# Patient Record
Sex: Female | Born: 1965 | Race: White | Hispanic: No | Smoking: Never smoker
Health system: Southern US, Community
[De-identification: ages and names within clinical notes are randomized; demographics above are authoritative.]

## PROBLEM LIST (undated history)

## (undated) DIAGNOSIS — E8881 Metabolic syndrome: Secondary | ICD-10-CM

## (undated) DIAGNOSIS — G43909 Migraine, unspecified, not intractable, without status migrainosus: Secondary | ICD-10-CM

## (undated) DIAGNOSIS — E88819 Insulin resistance, unspecified: Secondary | ICD-10-CM

## (undated) DIAGNOSIS — J45909 Unspecified asthma, uncomplicated: Secondary | ICD-10-CM

## (undated) DIAGNOSIS — F410 Panic disorder [episodic paroxysmal anxiety] without agoraphobia: Secondary | ICD-10-CM

## (undated) HISTORY — DX: Unspecified asthma, uncomplicated: J45.909

## (undated) HISTORY — DX: Insulin resistance, unspecified: E88.819

## (undated) HISTORY — DX: Metabolic syndrome: E88.81

## (undated) HISTORY — PX: CHOLECYSTECTOMY: SHX55

---
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
## 1998-01-09 ENCOUNTER — Other Ambulatory Visit: Admission: RE | Admit: 1998-01-09 | Discharge: 1998-01-09 | Payer: Self-pay | Admitting: Gynecology

## 1998-01-15 ENCOUNTER — Other Ambulatory Visit: Admission: RE | Admit: 1998-01-15 | Discharge: 1998-01-15 | Payer: Self-pay | Admitting: Obstetrics and Gynecology

## 1998-01-29 ENCOUNTER — Inpatient Hospital Stay (HOSPITAL_COMMUNITY): Admission: AD | Admit: 1998-01-29 | Discharge: 1998-02-01 | Payer: Self-pay | Admitting: Obstetrics and Gynecology

## 1998-04-12 ENCOUNTER — Other Ambulatory Visit: Admission: RE | Admit: 1998-04-12 | Discharge: 1998-04-12 | Payer: Self-pay | Admitting: Obstetrics and Gynecology

## 1999-09-06 ENCOUNTER — Other Ambulatory Visit: Admission: RE | Admit: 1999-09-06 | Discharge: 1999-09-06 | Payer: Self-pay | Admitting: Gynecology

## 2007-04-15 ENCOUNTER — Ambulatory Visit (HOSPITAL_COMMUNITY): Admission: RE | Admit: 2007-04-15 | Discharge: 2007-04-15 | Payer: Self-pay | Admitting: Family Medicine

## 2010-09-29 ENCOUNTER — Encounter: Payer: Self-pay | Admitting: Internal Medicine

## 2012-06-15 ENCOUNTER — Other Ambulatory Visit: Payer: Self-pay | Admitting: Nurse Practitioner

## 2012-10-29 ENCOUNTER — Other Ambulatory Visit: Payer: Self-pay | Admitting: Nurse Practitioner

## 2012-11-24 ENCOUNTER — Ambulatory Visit
Admission: RE | Admit: 2012-11-24 | Discharge: 2012-11-24 | Disposition: A | Discharge status: Home / Self Care | Payer: BC Managed Care – PPO | Source: Ambulatory Visit | Attending: Nurse Practitioner | Admitting: Nurse Practitioner

## 2012-11-24 DIAGNOSIS — Z1231 Encounter for screening mammogram for malignant neoplasm of breast: Secondary | ICD-10-CM

## 2012-11-26 ENCOUNTER — Other Ambulatory Visit: Payer: Self-pay | Admitting: Nurse Practitioner

## 2012-11-26 DIAGNOSIS — N63 Unspecified lump in unspecified breast: Secondary | ICD-10-CM

## 2012-11-29 ENCOUNTER — Encounter: Payer: Self-pay | Admitting: Nurse Practitioner

## 2012-11-29 ENCOUNTER — Ambulatory Visit (INDEPENDENT_AMBULATORY_CARE_PROVIDER_SITE_OTHER): Payer: BC Managed Care – PPO | Admitting: Nurse Practitioner

## 2012-11-29 VITALS — BP 110/70 | Temp 98.3°F | Wt 200.8 lb

## 2012-11-29 DIAGNOSIS — R928 Other abnormal and inconclusive findings on diagnostic imaging of breast: Secondary | ICD-10-CM

## 2012-11-29 DIAGNOSIS — R300 Dysuria: Secondary | ICD-10-CM

## 2012-11-29 DIAGNOSIS — J45909 Unspecified asthma, uncomplicated: Secondary | ICD-10-CM | POA: Insufficient documentation

## 2012-11-29 DIAGNOSIS — E8881 Metabolic syndrome: Secondary | ICD-10-CM | POA: Insufficient documentation

## 2012-11-29 LAB — POCT UA - MICROSCOPIC ONLY

## 2012-11-29 MED ORDER — CEFPROZIL 500 MG PO TABS: 500.0000 mg | ORAL_TABLET | Freq: Two times a day (BID) | ORAL | Status: AC

## 2012-11-29 MED ORDER — BUPROPION HCL ER (XL) 150 MG PO TB24: 150.0000 mg | ORAL_TABLET | Freq: Every day | ORAL | Status: DC

## 2012-11-29 NOTE — Assessment & Plan Note (Addendum)
Assessment: Dysuria possible UTI Cefzil 500 mg 1 by mouth twice a day x7 days. use AZO for 48 hours then discontinue.   Call back in 72 hours if no improvement. Warning signs reviewed.

## 2012-11-29 NOTE — Patient Instructions (Addendum)
May use AZO for 48 hours then discontinue.  Call back in 72 hours if no improvement.

## 2012-11-30 DIAGNOSIS — R928 Other abnormal and inconclusive findings on diagnostic imaging of breast: Secondary | ICD-10-CM | POA: Insufficient documentation

## 2012-11-30 NOTE — Progress Notes (Signed)
Subjective: Presents complaints of left lower abdominal pain for the past several days. No fever. Some urgency and discomfort with urination. No back pain. No discharge. Discomfort at the end of urination. No previous history of UTI. Some relief with AZO. Has a history of kidney stones. Taking fluids well. No nausea vomiting. No new sexual partners. Objective: NAD. Alert, oriented. Lungs clear. Heart regular rate rhythm. No CVA area tenderness. No flank tenderness. Abdomen soft nondistended with mild suprapubic discomfort. See urine microscopic results. Unable to do UA because of AZO use.

## 2012-12-07 ENCOUNTER — Telehealth: Payer: Self-pay | Admitting: Family Medicine

## 2012-12-07 HISTORY — PX: BREAST BIOPSY: SHX20

## 2012-12-07 NOTE — Telephone Encounter (Signed)
Pt stated she went to er because of back pain but pain stopped so she never signed in at the er. Today having low abd pain and painful urination. Stated she passed a stone sat. Taking advil for the pain. Has called [facility] but waiting for a call back.

## 2012-12-07 NOTE — Telephone Encounter (Signed)
May use and call in Cipro 500 one twice a day for 7 days. She should try to get in with [facility] f/u if fevers or worse. If wanting ov then weds

## 2012-12-07 NOTE — Telephone Encounter (Signed)
As sooner she is sessile she'stp Ciproo cefzil and go on for is . Use cipro. Hydrocodone 5/325  #30 no refills use one every 4 hours as needed for pain

## 2012-12-07 NOTE — Telephone Encounter (Signed)
Patient states she came in last week and saw Sara Powers for kidney stones and was told if she had another flare up to let her know.  She had to go to the ER over the weekend and by the time she got to the hospital it had stopped.  It has now flared up again.  What do you recommend?

## 2012-12-07 NOTE — Telephone Encounter (Signed)
Pt.notified

## 2012-12-07 NOTE — Telephone Encounter (Signed)
[place] pt to stop cefzil and start cipro. cipro and hydrocodone already called into [facility].

## 2012-12-07 NOTE — Telephone Encounter (Signed)
Pt states she is still taking cefzil prescribed by carolyn. Should she stop and start cipro or take both and can she have something for pain called in. Using advil now no relief. [employer]

## 2012-12-09 ENCOUNTER — Ambulatory Visit
Admission: RE | Admit: 2012-12-09 | Discharge: 2012-12-09 | Disposition: A | Discharge status: Home / Self Care | Payer: BC Managed Care – PPO | Source: Ambulatory Visit | Attending: Nurse Practitioner | Admitting: Nurse Practitioner

## 2012-12-09 ENCOUNTER — Other Ambulatory Visit: Payer: Self-pay | Admitting: Nurse Practitioner

## 2012-12-09 DIAGNOSIS — N63 Unspecified lump in unspecified breast: Secondary | ICD-10-CM

## 2012-12-15 ENCOUNTER — Ambulatory Visit
Admission: RE | Admit: 2012-12-15 | Discharge: 2012-12-15 | Disposition: A | Discharge status: Home / Self Care | Payer: BC Managed Care – PPO | Source: Ambulatory Visit | Attending: Nurse Practitioner | Admitting: Nurse Practitioner

## 2012-12-15 DIAGNOSIS — N63 Unspecified lump in unspecified breast: Secondary | ICD-10-CM

## 2013-08-03 ENCOUNTER — Other Ambulatory Visit: Payer: Self-pay | Admitting: *Deleted

## 2013-08-03 ENCOUNTER — Ambulatory Visit (INDEPENDENT_AMBULATORY_CARE_PROVIDER_SITE_OTHER): Payer: BC Managed Care – PPO | Admitting: Nurse Practitioner

## 2013-08-03 ENCOUNTER — Telehealth: Payer: Self-pay | Admitting: Family Medicine

## 2013-08-03 ENCOUNTER — Encounter: Payer: Self-pay | Admitting: Nurse Practitioner

## 2013-08-03 VITALS — BP 130/88 | Temp 98.1°F | Ht 66.0 in | Wt 202.0 lb

## 2013-08-03 DIAGNOSIS — I889 Nonspecific lymphadenitis, unspecified: Secondary | ICD-10-CM

## 2013-08-03 DIAGNOSIS — J329 Chronic sinusitis, unspecified: Secondary | ICD-10-CM

## 2013-08-03 MED ORDER — CEFUROXIME AXETIL 500 MG PO TABS: 500.0000 mg | ORAL_TABLET | Freq: Two times a day (BID) | ORAL | Status: DC

## 2013-08-03 MED ORDER — HYDROCODONE-ACETAMINOPHEN 5-325 MG PO TABS: 1.0000 | ORAL_TABLET | ORAL | Status: DC | PRN

## 2013-08-03 NOTE — Telephone Encounter (Signed)
Patient says that medication was not at pharmacy. She was seen today. Please advise.     Sara Powers

## 2013-08-03 NOTE — Telephone Encounter (Signed)
Discussed with pt med was sent over but i resent antibiotic to [facility].

## 2013-08-06 ENCOUNTER — Encounter: Payer: Self-pay | Admitting: Nurse Practitioner

## 2013-08-06 NOTE — Progress Notes (Signed)
Subjective:  Presents complaints of sore throat for the past several days. No fever. Facial area headache. Occasional cough. Producing green drainage. No wheezing. Very painful lump behind her right ear. Radiates into the right ear area.  Objective:   BP 130/88  Temp(Src) 98.1 F (36.7 C) (Oral)  Ht 5\' 6"  (1.676 m)  Wt 202 lb (91.627 kg)  BMI 32.62 kg/m2 NAD. Alert, oriented. TMs significant clear effusion, no erythema. Pharynx injected with PND noted. Neck supple with mild soft nontender adenopathy on the left, enlarged tender right tonsillar lymph node. Lungs clear. Heart regular rate rhythm.  Assessment:Rhinosinusitis  Lymphadenitis  Plan: Meds ordered this encounter  Medications  . DISCONTD: cefUROXime (CEFTIN) 500 MG tablet    Sig: Take 1 tablet (500 mg total) by mouth 2 (two) times daily with a meal.    Dispense:  20 tablet    Refill:  0    Order Specific Question:  Supervising Provider    Answer:  Merlyn Albert [[id number]]  . HYDROcodone-acetaminophen (NORCO/VICODIN) 5-325 MG per tablet    Sig: Take 1 tablet by mouth every 4 (four) hours as needed.    Dispense:  30 tablet    Refill:  0    Order Specific Question:  Supervising Provider    Answer:  Merlyn Albert [[id number]]   OTC meds as directed. Expect gradual resolution of lymphadenitis. Callback in 7-10 days if no improvement, sooner if worse. Recheck if persists.

## 2013-09-05 ENCOUNTER — Ambulatory Visit (INDEPENDENT_AMBULATORY_CARE_PROVIDER_SITE_OTHER): Payer: BC Managed Care – PPO | Admitting: Nurse Practitioner

## 2013-09-05 ENCOUNTER — Encounter: Payer: Self-pay | Admitting: Nurse Practitioner

## 2013-09-05 VITALS — BP 120/80 | Ht 66.0 in | Wt 200.0 lb

## 2013-09-05 DIAGNOSIS — F341 Dysthymic disorder: Secondary | ICD-10-CM

## 2013-09-05 DIAGNOSIS — F329 Major depressive disorder, single episode, unspecified: Secondary | ICD-10-CM

## 2013-09-05 DIAGNOSIS — E8881 Metabolic syndrome: Secondary | ICD-10-CM

## 2013-09-05 MED ORDER — BUPROPION HCL ER (XL) 300 MG PO TB24: 300.0000 mg | ORAL_TABLET | Freq: Every day | ORAL | Status: DC

## 2013-09-08 ENCOUNTER — Encounter: Payer: Self-pay | Admitting: Nurse Practitioner

## 2013-09-08 DIAGNOSIS — F329 Major depressive disorder, single episode, unspecified: Secondary | ICD-10-CM | POA: Insufficient documentation

## 2013-09-08 DIAGNOSIS — F419 Anxiety disorder, unspecified: Secondary | ICD-10-CM | POA: Insufficient documentation

## 2013-09-08 NOTE — Assessment & Plan Note (Signed)
.   buPROPion (WELLBUTRIN XL) 300 MG 24 hr tablet    Sig: Take 1 tablet (300 mg total) by mouth daily.    Dispense:  30 tablet    Refill:  2    Order Specific Question:  Supervising Provider    Answer:  Merlyn AlbertLUKING, WILLIAM S [[id number]]   recommend healthy diet, regular activity and weight loss. Discussed importance of stress reduction. Will increase Wellbutrin dose to 300 mg, patient to call if any problems with new dosing. Reminded patient about potential for adverse effects if Wellbutrin is taking with alcohol. Recheck in 3 months, call back sooner if any problems.

## 2013-09-08 NOTE — Assessment & Plan Note (Signed)
.   buPROPion (WELLBUTRIN XL) 300 MG 24 hr tablet    Sig: Take 1 tablet (300 mg total) by mouth daily.    Dispense:  30 tablet    Refill:  2    Order Specific Question:  Supervising Provider    Answer:  LUKING, WILLIAM S [[id number]]   recommend healthy diet, regular activity and weight loss. Discussed importance of stress reduction. Will increase Wellbutrin dose to 300 mg, patient to call if any problems with new dosing. Reminded patient about potential for adverse effects if Wellbutrin is taking with alcohol. Recheck in 3 months, call back sooner if any problems. 

## 2013-09-08 NOTE — Progress Notes (Signed)
Subjective:  Presents for routine followup. FBS at home has run 123-142. Would like to increase her Wellbutrin dose. 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 Has not done well with her diet lately. Minimal exercise. No chest pain shortness of breath or edema. Gets regular preventive health physicals.  Objective:   BP 120/80  Ht 5\' 6"  (1.676 m)  Wt 200 lb (90.719 kg)  BMI 32.30 kg/m2 NAD. Alert, oriented. Lungs clear. Heart regular rhythm. Lower extremities no edema. Hemoglobin A1c 5.0.   Assessment:Insulin resistance - Plan: POCT glycosylated hemoglobin (Hb A1C)  Anxiety and depression   Plan: Meds ordered this encounter  Medications  . buPROPion (WELLBUTRIN XL) 300 MG 24 hr tablet    Sig: Take 1 tablet (300 mg total) by mouth daily.    Dispense:  30 tablet    Refill:  2    Order Specific Question:  Supervising Provider    Answer:  Merlyn AlbertLUKING, WILLIAM S [[id number]]   recommend healthy diet, regular activity and weight loss. Discussed importance of stress reduction. Will increase Wellbutrin dose to 300 mg, patient to call if any problems with new dosing. Reminded patient about potential for adverse effects if Wellbutrin is taking with alcohol. Recheck in 3 months, call back sooner if any problems.

## 2013-09-14 ENCOUNTER — Telehealth: Payer: Self-pay | Admitting: Family Medicine

## 2013-09-14 NOTE — Telephone Encounter (Signed)
Patient says that generic wellbutrin is not doing good. She says she wants to try something more for just anxiety. Please advise.

## 2013-09-14 NOTE — Telephone Encounter (Signed)
Patient would like to stop Wellbutrin. She has tried Lexapro in the past, and liked it. She is also open to other anxiety meds.

## 2013-09-14 NOTE — Telephone Encounter (Signed)
As we discussed Wellbutrin alone (generic or name brand) are not the best choices for anxiety. Two questions: has she taken any other meds in the past? Does she want to continue the low dose Wellbutrin 150 mg and add on med or stop this and switch?

## 2013-09-15 ENCOUNTER — Other Ambulatory Visit: Payer: Self-pay | Admitting: Nurse Practitioner

## 2013-09-15 MED ORDER — ESCITALOPRAM OXALATE 10 MG PO TABS: 10.0000 mg | ORAL_TABLET | Freq: Every day | ORAL | Status: DC

## 2013-09-20 NOTE — Progress Notes (Signed)
Left message on voicemail to return call.

## 2013-09-22 NOTE — Progress Notes (Signed)
Discussed with patient. Med sent to pharm.  

## 2013-11-30 ENCOUNTER — Ambulatory Visit: Payer: BC Managed Care – PPO | Admitting: Family Medicine

## 2013-12-07 ENCOUNTER — Encounter: Payer: Self-pay | Admitting: *Deleted

## 2014-03-24 ENCOUNTER — Other Ambulatory Visit: Payer: Self-pay

## 2014-03-24 DIAGNOSIS — Z1231 Encounter for screening mammogram for malignant neoplasm of breast: Secondary | ICD-10-CM

## 2014-03-28 ENCOUNTER — Ambulatory Visit
Admission: RE | Admit: 2014-03-28 | Discharge: 2014-03-28 | Disposition: A | Discharge status: Home / Self Care | Payer: BC Managed Care – PPO | Source: Ambulatory Visit

## 2014-03-28 ENCOUNTER — Encounter (INDEPENDENT_AMBULATORY_CARE_PROVIDER_SITE_OTHER): Payer: Self-pay

## 2014-03-28 DIAGNOSIS — Z1231 Encounter for screening mammogram for malignant neoplasm of breast: Secondary | ICD-10-CM

## 2014-06-02 ENCOUNTER — Ambulatory Visit (INDEPENDENT_AMBULATORY_CARE_PROVIDER_SITE_OTHER): Payer: BC Managed Care – PPO | Admitting: Nurse Practitioner

## 2014-06-02 VITALS — BP 118/86 | Ht 66.0 in | Wt 200.0 lb

## 2014-06-02 DIAGNOSIS — R5383 Other fatigue: Secondary | ICD-10-CM

## 2014-06-02 DIAGNOSIS — F329 Major depressive disorder, single episode, unspecified: Secondary | ICD-10-CM

## 2014-06-02 DIAGNOSIS — N920 Excessive and frequent menstruation with regular cycle: Secondary | ICD-10-CM

## 2014-06-02 DIAGNOSIS — F341 Dysthymic disorder: Secondary | ICD-10-CM

## 2014-06-02 DIAGNOSIS — R5381 Other malaise: Secondary | ICD-10-CM

## 2014-06-02 DIAGNOSIS — Z79899 Other long term (current) drug therapy: Secondary | ICD-10-CM

## 2014-06-02 DIAGNOSIS — F419 Anxiety disorder, unspecified: Principal | ICD-10-CM

## 2014-06-02 DIAGNOSIS — Z0189 Encounter for other specified special examinations: Secondary | ICD-10-CM

## 2014-06-02 MED ORDER — VENLAFAXINE HCL ER 37.5 MG PO CP24: ORAL_CAPSULE | ORAL | Status: DC

## 2014-06-05 ENCOUNTER — Encounter: Payer: Self-pay | Admitting: Nurse Practitioner

## 2014-06-05 DIAGNOSIS — N920 Excessive and frequent menstruation with regular cycle: Secondary | ICD-10-CM | POA: Insufficient documentation

## 2014-06-05 NOTE — Progress Notes (Signed)
Subjective:  Presents for recheck on her anxiety and depression. Did not see much improvement on Lexapro. Would like to try Effexor, her sister is taking this with good results. Having regular cycles with very heavy flow requiring a tampon and a pad lasting about 3 days. Generalized fatigue. Continues significant stress in her personal life. Some sleep disturbance. Social isolation. Denies suicidal thoughts or ideation.  Objective:   BP 118/86  Ht  (1.676 m)  Wt 200 lb (90.719 kg)  BMI 32.30 kg/m2 NAD. Alert, oriented. Cheerful affect. Fatigued in appearance. Lungs clear. Heart regular rate rhythm.  Assessment:  Problem List Items Addressed This Visit     Other   Anxiety and depression - Primary (Chronic)    Other Visit Diagnoses   Other malaise and fatigue        Relevant Orders       CBC with Differential       TSH       Vit D  25 hydroxy (rtn osteoporosis monitoring)    Encounter for long-term (current) use of other medications        Relevant Orders       Hepatic function panel       Basic metabolic panel    Other specified examination        Relevant Orders       Lipid panel    Menorrhagia with regular cycle          Plan:  Meds ordered this encounter  Medications  . venlafaxine XR (EFFEXOR-XR) 37.5 MG 24 hr capsule    Sig: One po qd x 6 d then 2 each day    Dispense:  60 capsule    Refill:  0    Order Specific Question:  Supervising Provider    Answer:  Riccardo Dubin   Lab work pending. Recommend that she talk to her gynecologist regarding options for heavy bleeding. Reviewed potential adverse effects of Effexor. DC med and call if any problems. Return in about 1 month (around 07/02/2014).

## 2014-06-06 LAB — CBC WITH DIFFERENTIAL/PLATELET
BASOS ABS: 0.1 10*3/uL (ref 0.0–0.1)
Basophils Relative: 1 % (ref 0–1)
Eosinophils Absolute: 0.2 10*3/uL (ref 0.0–0.7)
Eosinophils Relative: 3 % (ref 0–5)
HEMATOCRIT: 38.4 % (ref 36.0–46.0)
HEMOGLOBIN: 13.3 g/dL (ref 12.0–15.0)
LYMPHS ABS: 2.1 10*3/uL (ref 0.7–4.0)
LYMPHS PCT: 35 % (ref 12–46)
MCH: 29.4 pg (ref 26.0–34.0)
MCHC: 34.6 g/dL (ref 30.0–36.0)
MCV: 85 fL (ref 78.0–100.0)
MONO ABS: 0.4 10*3/uL (ref 0.1–1.0)
Monocytes Relative: 7 % (ref 3–12)
NEUTROS ABS: 3.2 10*3/uL (ref 1.7–7.7)
Neutrophils Relative %: 54 % (ref 43–77)
Platelets: 321 10*3/uL (ref 150–400)
RBC: 4.52 MIL/uL (ref 3.87–5.11)
RDW: 13.6 % (ref 11.5–15.5)
WBC: 5.9 10*3/uL (ref 4.0–10.5)

## 2014-06-06 LAB — TSH: TSH: 3.056 u[IU]/mL (ref 0.350–4.500)

## 2014-06-06 LAB — LIPID PANEL
CHOL/HDL RATIO: 3.9 ratio
CHOLESTEROL: 177 mg/dL (ref 0–200)
HDL: 45 mg/dL (ref >39)
LDL Cholesterol: 106 mg/dL — ABNORMAL HIGH (ref 0–99)
Triglycerides: 129 mg/dL (ref <150)
VLDL: 26 mg/dL (ref 0–40)

## 2014-06-06 LAB — BASIC METABOLIC PANEL
BUN: 10 mg/dL (ref 6–23)
CALCIUM: 9 mg/dL (ref 8.4–10.5)
CO2: 22 mEq/L (ref 19–32)
Chloride: 106 mEq/L (ref 96–112)
Creat: 0.7 mg/dL (ref 0.50–1.10)
Glucose, Bld: 108 mg/dL — ABNORMAL HIGH (ref 70–99)
POTASSIUM: 4.1 meq/L (ref 3.5–5.3)
SODIUM: 136 meq/L (ref 135–145)

## 2014-06-06 LAB — HEPATIC FUNCTION PANEL
ALBUMIN: 4 g/dL (ref 3.5–5.2)
ALT: 17 U/L (ref 0–35)
AST: 15 U/L (ref 0–37)
Alkaline Phosphatase: 60 U/L (ref 39–117)
Bilirubin, Direct: 0.1 mg/dL (ref 0.0–0.3)
Indirect Bilirubin: 0.6 mg/dL (ref 0.2–1.2)
TOTAL PROTEIN: 6.5 g/dL (ref 6.0–8.3)
Total Bilirubin: 0.7 mg/dL (ref 0.2–1.2)

## 2014-06-07 LAB — VITAMIN D 25 HYDROXY (VIT D DEFICIENCY, FRACTURES): VIT D 25 HYDROXY: 31 ng/mL (ref 30–89)

## 2014-06-14 ENCOUNTER — Other Ambulatory Visit: Payer: Self-pay | Admitting: Nurse Practitioner

## 2014-06-14 DIAGNOSIS — R7301 Impaired fasting glucose: Secondary | ICD-10-CM | POA: Insufficient documentation

## 2014-06-14 MED ORDER — METFORMIN HCL 500 MG PO TABS: 500.0000 mg | ORAL_TABLET | Freq: Two times a day (BID) | ORAL | Status: DC

## 2014-07-06 ENCOUNTER — Ambulatory Visit: Payer: BC Managed Care – PPO | Admitting: Nurse Practitioner

## 2014-07-19 ENCOUNTER — Ambulatory Visit (INDEPENDENT_AMBULATORY_CARE_PROVIDER_SITE_OTHER): Payer: BC Managed Care – PPO | Admitting: Nurse Practitioner

## 2014-07-19 ENCOUNTER — Encounter: Payer: Self-pay | Admitting: Nurse Practitioner

## 2014-07-19 VITALS — BP 110/74 | Ht 66.0 in | Wt 200.0 lb

## 2014-07-19 DIAGNOSIS — F418 Other specified anxiety disorders: Secondary | ICD-10-CM

## 2014-07-19 DIAGNOSIS — F419 Anxiety disorder, unspecified: Principal | ICD-10-CM

## 2014-07-19 DIAGNOSIS — F329 Major depressive disorder, single episode, unspecified: Secondary | ICD-10-CM

## 2014-07-19 DIAGNOSIS — F32A Depression, unspecified: Secondary | ICD-10-CM

## 2014-07-19 MED ORDER — ALBUTEROL SULFATE HFA 108 (90 BASE) MCG/ACT IN AERS: 2.0000 | INHALATION_SPRAY | RESPIRATORY_TRACT | Status: DC | PRN

## 2014-07-19 MED ORDER — CITALOPRAM HYDROBROMIDE 20 MG PO TABS: ORAL_TABLET | ORAL | Status: DC

## 2014-07-20 ENCOUNTER — Encounter: Payer: Self-pay | Admitting: Nurse Practitioner

## 2014-07-20 NOTE — Progress Notes (Signed)
Subjective:  Presents for follow-up. Was unable to take Effexor due to significant agitation and dizziness. Has stopped medication. Continues to experience some anxiety symptoms. Under a lot of stress particularly at work. See previous note.  Objective:   BP 110/74 mmHg  Ht 5\' 6"  (1.676 m)  Wt 200 lb (90.719 kg)  BMI 32.30 kg/m2 NAD. Alert, oriented. Lungs clear. Heart regular rate rhythm. Mildly anxious affect.  Assessment:  Problem List Items Addressed This Visit      Other   Anxiety and depression - Primary (Chronic)     Plan: Meds ordered this encounter  Medications  . citalopram (CELEXA) 20 MG tablet    Sig: 1/2 po qhs x 6 days then one po qhs    Dispense:  30 tablet    Refill:  2    Order Specific Question:  Supervising Provider    Answer:  Merlyn AlbertLUKING, WILLIAM S [[id number]]  . albuterol (PROVENTIL HFA;VENTOLIN HFA) 108 (90 BASE) MCG/ACT inhaler    Sig: Inhale 2 puffs into the lungs every 4 (four) hours as needed for wheezing or shortness of breath.    Dispense:  1 Inhaler    Refill:  2    Order Specific Question:  Supervising Provider    Answer:  Merlyn AlbertLUKING, WILLIAM S [[id number]]   Trial of Celexa; DC med and call if any problems. Also refill albuterol inhaler for patient to have on hand if needed. Strongly encourage preventive health physical. Return in about 1 month (around 08/18/2014). Call back sooner if any problems.

## 2014-08-28 ENCOUNTER — Ambulatory Visit (INDEPENDENT_AMBULATORY_CARE_PROVIDER_SITE_OTHER): Payer: BC Managed Care – PPO | Admitting: Nurse Practitioner

## 2014-08-28 ENCOUNTER — Encounter: Payer: Self-pay | Admitting: Nurse Practitioner

## 2014-08-28 VITALS — BP 136/90 | Ht 66.0 in | Wt 203.0 lb

## 2014-08-28 DIAGNOSIS — F32A Depression, unspecified: Secondary | ICD-10-CM

## 2014-08-28 DIAGNOSIS — F418 Other specified anxiety disorders: Secondary | ICD-10-CM

## 2014-08-28 DIAGNOSIS — F419 Anxiety disorder, unspecified: Principal | ICD-10-CM

## 2014-08-28 DIAGNOSIS — F329 Major depressive disorder, single episode, unspecified: Secondary | ICD-10-CM

## 2014-08-28 MED ORDER — CLONAZEPAM 0.5 MG PO TABS: ORAL_TABLET | ORAL | Status: DC

## 2014-08-28 MED ORDER — ESCITALOPRAM OXALATE 10 MG PO TABS: 10.0000 mg | ORAL_TABLET | Freq: Every day | ORAL | Status: DC

## 2014-08-28 NOTE — Progress Notes (Signed)
Subjective:  Presents for follow-up. Was unable to take metformin, made her feel bad. Remembers taking it in the past and experiencing some hypoglycemia. Was unable to check her sugar, has run out of testing strips which she plans to purchase. Sleeping well. Continues to be under significant stress dealing with her ex-husband. Did not see any improvement on Celexa, would like to go back to Lexapro.  Objective:   BP 136/90 mmHg  Ht 5\' 6"  (1.676 m)  Wt 203 lb (92.08 kg)  BMI 32.78 kg/m2 NAD. Alert, oriented. Mildly anxious affect. Lungs clear. Heart regular rate rhythm.  Assessment:  Problem List Items Addressed This Visit      Other   Anxiety and depression - Primary (Chronic)     Plan: Meds ordered this encounter  Medications  . escitalopram (LEXAPRO) 10 MG tablet    Sig: Take 1 tablet (10 mg total) by mouth daily.    Dispense:  30 tablet    Refill:  2    Order Specific Question:  Supervising Provider    Answer:  Merlyn AlbertLUKING, WILLIAM S [[id number]]  . clonazePAM (KLONOPIN) 0.5 MG tablet    Sig: 1/2-1 po BID prn anxiety    Dispense:  30 tablet    Refill:  0    Order Specific Question:  Supervising Provider    Answer:  Merlyn AlbertLUKING, WILLIAM S [[id number]]   Restart Lexapro as directed. After 2 weeks may increase to 20 mg if needed. Given prescription for Klonopin to use sparingly for extreme anxiety or panic attacks. Strongly encourage preventive health physical. Return in about 3 months (around 11/27/2014).

## 2014-08-28 NOTE — Patient Instructions (Signed)
[email or web address] 

## 2015-01-05 ENCOUNTER — Ambulatory Visit: Payer: BC Managed Care – PPO | Admitting: Nurse Practitioner

## 2015-02-08 ENCOUNTER — Ambulatory Visit (INDEPENDENT_AMBULATORY_CARE_PROVIDER_SITE_OTHER): Payer: BC Managed Care – PPO | Admitting: Nurse Practitioner

## 2015-02-08 VITALS — BP 134/88 | Ht 66.0 in | Wt 214.1 lb

## 2015-02-08 DIAGNOSIS — R0600 Dyspnea, unspecified: Secondary | ICD-10-CM | POA: Diagnosis not present

## 2015-02-08 DIAGNOSIS — I491 Atrial premature depolarization: Secondary | ICD-10-CM

## 2015-02-08 DIAGNOSIS — K2100 Gastro-esophageal reflux disease with esophagitis, without bleeding: Secondary | ICD-10-CM

## 2015-02-08 DIAGNOSIS — R5383 Other fatigue: Secondary | ICD-10-CM | POA: Diagnosis not present

## 2015-02-08 DIAGNOSIS — R7301 Impaired fasting glucose: Secondary | ICD-10-CM | POA: Diagnosis not present

## 2015-02-08 DIAGNOSIS — E8881 Metabolic syndrome: Secondary | ICD-10-CM

## 2015-02-08 LAB — POCT HEMOGLOBIN: HEMOGLOBIN: 5.6 g/dL — AB (ref 12.2–16.2)

## 2015-02-08 MED ORDER — RANITIDINE HCL 300 MG PO TABS: 300.0000 mg | ORAL_TABLET | Freq: Every day | ORAL | Status: DC

## 2015-02-09 ENCOUNTER — Encounter: Payer: Self-pay | Admitting: Nurse Practitioner

## 2015-02-09 NOTE — Progress Notes (Signed)
Subjective:  Presents for c/o "pauses" in her pulse, irregular heartbeat with occasional pounding sensation occuring off and on since September, worse over the past month. Has also experienced increased SOB with activity, weight gain and abdominal bloating. Minimal edema in the ankles. No orthopnea or unusual cough. Extreme fatigue. Has persistent significant stress. Flare up of acid reflux. Some hoarseness. Occasional burning in the chest with brief sharp pain. Non smoker. FBS at home yesterday 134 and today 128. Had a full cardiac work up for chest pain about 15 years ago which was neg.   Objective:   BP 134/88 mmHg  Ht 5\' 6"  (1.676 m)  Wt 214 lb 2 oz (97.126 kg)  BMI 34.58 kg/m2  SpO2 98%  LMP 02/08/2015 NAD. Alert, oriented. Mildly anxious affect. Lungs clear. Heart slightly irregular rhythm with rate in the 70s. No murmur or gallop noted. Lower extremities trace non pitting edema. EKG: occas PAC otherwise normal. Abdomen soft, non distended, non tender.  Results for orders placed or performed in visit on 02/08/15  POCT hemoglobin  Result Value Ref Range   Hemoglobin 5.6 (A) 12.2 - 16.2 g/dL     Assessment:  Problem List Items Addressed This Visit      Endocrine   Impaired fasting glucose   Relevant Orders   POCT hemoglobin (Completed)     Other   Insulin resistance   Relevant Orders   POCT hemoglobin (Completed)    Other Visit Diagnoses    Dyspnea    -  Primary    Relevant Orders    EKG 12-Lead    Other fatigue        Gastroesophageal reflux disease with esophagitis          Plan:  Meds ordered this encounter  Medications  . ranitidine (ZANTAC) 300 MG tablet    Sig: Take 1 tablet (300 mg total) by mouth at bedtime.    Dispense:  30 tablet    Refill:  5    Order Specific Question:  Supervising Provider    Answer:  Merlyn AlbertLUKING, WILLIAM S [[id number]]   Restart Zantac as directed. Avoid caffeine and other stimulants. Discussed stress reduction. Refer to cardiology. Warning  signs reviewed. Call or go to ED sooner if problems.

## 2015-02-14 ENCOUNTER — Encounter: Payer: Self-pay | Admitting: Family Medicine

## 2015-02-26 ENCOUNTER — Ambulatory Visit: Payer: BC Managed Care – PPO | Admitting: Internal Medicine

## 2015-07-18 ENCOUNTER — Encounter: Payer: Self-pay | Admitting: Family Medicine

## 2015-07-18 ENCOUNTER — Ambulatory Visit (INDEPENDENT_AMBULATORY_CARE_PROVIDER_SITE_OTHER): Payer: BC Managed Care – PPO | Admitting: Family Medicine

## 2015-07-18 VITALS — Temp 98.4°F | Ht 66.0 in | Wt 207.4 lb

## 2015-07-18 DIAGNOSIS — N3000 Acute cystitis without hematuria: Secondary | ICD-10-CM

## 2015-07-18 MED ORDER — CIPROFLOXACIN HCL 500 MG PO TABS: 500.0000 mg | ORAL_TABLET | Freq: Two times a day (BID) | ORAL | Status: DC

## 2015-07-18 NOTE — Progress Notes (Signed)
   Subjective:    Patient ID: Sara Powers, female    DOB: 06/20/1966, 49 y.o.   MRN: [id number]  HPI Patient arrives with c/o dysuria for one day. Patient concerned with UTI vs kidney stone. Patient currently using AZO  patient is worried about possible kidney stone she relates dysuria urinary from C denies high fever chills or flank pain. PMH benign other than kidney stones.  Review of Systems     No nausea no vomiting no diarrhea. Some dysuria some urinary frequency. Objective:   Physical Exam   lungs clear heart regular flanks nontender abdomen soft minimal lower abdominal tenderness UA with a BB sees      Assessment & Plan:   urine culture urine shows UTI treat with antibiotics warning signs discussed follow-up if problems

## 2015-07-20 LAB — URINE CULTURE

## 2015-07-22 ENCOUNTER — Encounter: Payer: Self-pay | Admitting: Family Medicine

## 2015-09-07 ENCOUNTER — Ambulatory Visit (INDEPENDENT_AMBULATORY_CARE_PROVIDER_SITE_OTHER): Payer: BC Managed Care – PPO | Admitting: Nurse Practitioner

## 2015-09-07 ENCOUNTER — Encounter: Payer: Self-pay | Admitting: Nurse Practitioner

## 2015-09-07 VITALS — BP 122/82 | Ht 66.0 in | Wt 208.2 lb

## 2015-09-07 DIAGNOSIS — R7301 Impaired fasting glucose: Secondary | ICD-10-CM | POA: Diagnosis not present

## 2015-09-07 LAB — POCT GLYCOSYLATED HEMOGLOBIN (HGB A1C): HEMOGLOBIN A1C: 5.7

## 2015-09-07 MED ORDER — PHENTERMINE HCL 37.5 MG PO TABS: 37.5000 mg | ORAL_TABLET | Freq: Every day | ORAL | Status: DC

## 2015-09-10 ENCOUNTER — Encounter: Payer: Self-pay | Admitting: Nurse Practitioner

## 2015-09-10 NOTE — Progress Notes (Signed)
Subjective:  Presents for recheck on her sugar. Active lifestyle. No CP/ischemic type pain or SOB. Would like to start Phentermine for weight loss. Takes Zantac on prn basis.   Objective:   BP 122/82 mmHg  Ht 5\' 6"  (1.676 m)  Wt 208 lb 4 oz (94.462 kg)  BMI 33.63 kg/m2 NAD. Alert, oriented. Lungs clear. Heart RRR. Abdomen soft, non tender.  Results for orders placed or performed in visit on 09/07/15  POCT HgB A1C  Result Value Ref Range   Hemoglobin A1C 5.7      Assessment:  Problem List Items Addressed This Visit      Endocrine   Impaired fasting glucose - Primary   Relevant Orders   POCT HgB A1C (Completed)    Other Visit Diagnoses    Morbid obesity due to excess calories (HCC)        Relevant Medications    phentermine (ADIPEX-P) 37.5 MG tablet      Plan:  Meds ordered this encounter  Medications  . phentermine (ADIPEX-P) 37.5 MG tablet    Sig: Take 1 tablet (37.5 mg total) by mouth daily before breakfast.    Dispense:  30 tablet    Refill:  0    Order Specific Question:  Supervising Provider    Answer:  Merlyn AlbertLUKING, WILLIAM S [[id number]]   Encouraged regular exercise and diet limited in sugar and simple carbs. Reviewed potential side effects. DC med and call if any problems.  Return in about 1 month (around 10/08/2015).

## 2015-09-21 ENCOUNTER — Ambulatory Visit (INDEPENDENT_AMBULATORY_CARE_PROVIDER_SITE_OTHER): Payer: BC Managed Care – PPO | Admitting: Family Medicine

## 2015-09-21 ENCOUNTER — Encounter: Payer: Self-pay | Admitting: Family Medicine

## 2015-09-21 VITALS — BP 122/78 | Ht 66.0 in | Wt 208.0 lb

## 2015-09-21 DIAGNOSIS — M549 Dorsalgia, unspecified: Secondary | ICD-10-CM

## 2015-09-21 DIAGNOSIS — N3000 Acute cystitis without hematuria: Secondary | ICD-10-CM

## 2015-09-21 MED ORDER — NITROFURANTOIN MONOHYD MACRO 100 MG PO CAPS: 100.0000 mg | ORAL_CAPSULE | Freq: Two times a day (BID) | ORAL | Status: AC

## 2015-09-21 NOTE — Progress Notes (Signed)
   Subjective:    Patient ID: Sara BraceStacey M Hann, female    DOB: 10/05/1965, 50 y.o.   MRN: [id number]  Dysuria  This is a new problem. The current episode started in the past 7 days. Associated symptoms include frequency. Treatments tried: azo.   Also notes history of kidney stone. Kidneys some the past was much more painful situation. Next  This is more increased frequency with dysuria.  No fever or chills but does note some low back discomfort. Back pain is primarily lumbar worse with certain motions. On further history he has done some recent lifting and moving and cleaning in her house.   Review of Systems  Genitourinary: Positive for dysuria and frequency.   no vomiting no rash     Objective:   Physical Exam  Alert vitals stable lungs clear. Heart regular in rhythm. No CVA tenderness diffuse low abdominal tenderness mild lumbar tenderness left greater than right negative straight leg raise  Urinalysis 4-6 white blood cells no red blood cell      Assessment & Plan:  Impression 1 recurrent or Neri tract infection with element of lumbar strain plan symptom care discussed warning signs discussed antibiotics prescribed. WSL

## 2015-10-11 ENCOUNTER — Ambulatory Visit: Payer: BC Managed Care – PPO | Admitting: Nurse Practitioner

## 2015-10-23 ENCOUNTER — Other Ambulatory Visit: Payer: Self-pay | Admitting: Nurse Practitioner

## 2015-10-23 ENCOUNTER — Telehealth: Payer: Self-pay | Admitting: Nurse Practitioner

## 2015-10-23 MED ORDER — RIZATRIPTAN BENZOATE 10 MG PO TBDP: 10.0000 mg | ORAL_TABLET | ORAL | Status: DC | PRN

## 2015-10-23 NOTE — Telephone Encounter (Signed)
[facility] 10/23/15 

## 2015-10-23 NOTE — Telephone Encounter (Signed)
What does she normally take for acute migraine treatment? We have nothing in electronic record. Thanks.

## 2015-10-23 NOTE — Telephone Encounter (Signed)
Most likely this was generic Maxalt; order sent in; call back if no improvement

## 2015-10-23 NOTE — Telephone Encounter (Signed)
[facility]

## 2015-10-23 NOTE — Telephone Encounter (Signed)
Patient states unsure what the medication was called. States it was a dissolvable tablet

## 2015-10-23 NOTE — Telephone Encounter (Signed)
Pt is calling to see if she can get something called in for a migraine?  She hasn't had any meds in a while for it but has a terrible migraine Set off this morning.   Robbie Lis

## 2015-10-24 NOTE — Telephone Encounter (Signed)
Left message to return call 

## 2015-10-25 NOTE — Telephone Encounter (Signed)
Discussed with patient. Patient advised Most likely this was generic Maxalt; order sent in; call back if no improvement. Patient verbalized understanding.

## 2015-11-02 ENCOUNTER — Encounter: Payer: Self-pay | Admitting: Family Medicine

## 2015-11-02 ENCOUNTER — Ambulatory Visit (INDEPENDENT_AMBULATORY_CARE_PROVIDER_SITE_OTHER): Payer: BC Managed Care – PPO | Admitting: Family Medicine

## 2015-11-02 VITALS — Temp 98.7°F | Ht 66.0 in | Wt 207.2 lb

## 2015-11-02 DIAGNOSIS — N3000 Acute cystitis without hematuria: Secondary | ICD-10-CM | POA: Diagnosis not present

## 2015-11-02 DIAGNOSIS — R300 Dysuria: Secondary | ICD-10-CM | POA: Diagnosis not present

## 2015-11-02 MED ORDER — LEVOFLOXACIN 500 MG PO TABS: 500.0000 mg | ORAL_TABLET | Freq: Every day | ORAL | Status: DC

## 2015-11-02 NOTE — Progress Notes (Signed)
   Subjective:    Patient ID: Sara Powers, female    DOB: 08/03/1966, 50 y.o.   MRN: [id number]  Dysuria  This is a new problem. The current episode started in the past 7 days. Associated symptoms comments: Headache for 2 weeks. She has tried acetaminophen for the symptoms.   Patient with urinary symptoms dysuria urinary frequency symptoms over the past few days denies high fever chills sweats nausea vomiting diarrhea PMH frequent UTIs   Review of Systems  Genitourinary: Positive for dysuria.   see above. No cough wheezing difficulty breathing     Objective:   Physical Exam  Lungs clear hearts regular flanks nontender abdomen soft no guarding rebound WBCs on urinalysis      Assessment & Plan:  UTI antibiotics prescribed warning signs discussed culture taken awake the results

## 2015-11-04 LAB — URINE CULTURE

## 2015-12-28 ENCOUNTER — Ambulatory Visit (INDEPENDENT_AMBULATORY_CARE_PROVIDER_SITE_OTHER): Payer: BC Managed Care – PPO | Admitting: Nurse Practitioner

## 2015-12-28 ENCOUNTER — Encounter: Payer: Self-pay | Admitting: Nurse Practitioner

## 2015-12-28 VITALS — BP 124/90 | Ht 66.0 in | Wt 209.0 lb

## 2015-12-28 DIAGNOSIS — E8881 Metabolic syndrome: Secondary | ICD-10-CM | POA: Diagnosis not present

## 2015-12-28 DIAGNOSIS — F418 Other specified anxiety disorders: Secondary | ICD-10-CM

## 2015-12-28 DIAGNOSIS — R7301 Impaired fasting glucose: Secondary | ICD-10-CM

## 2015-12-28 DIAGNOSIS — F419 Anxiety disorder, unspecified: Secondary | ICD-10-CM

## 2015-12-28 DIAGNOSIS — F329 Major depressive disorder, single episode, unspecified: Secondary | ICD-10-CM

## 2015-12-28 MED ORDER — METFORMIN HCL 500 MG PO TABS: ORAL_TABLET | ORAL | Status: DC

## 2015-12-28 MED ORDER — BUPROPION HCL ER (XL) 150 MG PO TB24: 150.0000 mg | ORAL_TABLET | Freq: Every day | ORAL | Status: DC

## 2015-12-29 ENCOUNTER — Encounter: Payer: Self-pay | Admitting: Nurse Practitioner

## 2015-12-29 NOTE — Progress Notes (Signed)
Subjective:  Presents for recheck. Could not take phentermine due to side effects. Was on Metformin and Wellbutrin at one time. Did very well. Lost about 30 lbs. Came off med at that time. Continues to have personal stress. Also some burning and pain in the upper epigastric area at times. Is not taking Zantac. Worse with certain foods. Has started back drinking caffeine. No tobacco or alcohol use.   Objective:   BP 124/90 mmHg  Ht 5\' 6"  (1.676 m)  Wt 209 lb (94.802 kg)  BMI 33.75 kg/m2 NAD. Alert, oriented. Lungs clear. Heart RRR. Abdomen soft, non distended with mild upper epigastric area tenderness.   Assessment:  Problem List Items Addressed This Visit      Endocrine   Impaired fasting glucose - Primary     Other   Anxiety and depression (Chronic)   Insulin resistance     Plan:  Meds ordered this encounter  Medications  . metFORMIN (GLUCOPHAGE) 500 MG tablet    Sig: One po qhs    Dispense:  30 tablet    Refill:  2    Order Specific Question:  Supervising Provider    Answer:  Merlyn AlbertLUKING, WILLIAM S [[id number]]  . buPROPion (WELLBUTRIN XL) 150 MG 24 hr tablet    Sig: Take 1 tablet (150 mg total) by mouth daily.    Dispense:  30 tablet    Refill:  2    Order Specific Question:  Supervising Provider    Answer:  Merlyn AlbertLUKING, WILLIAM S [[id number]]   Trial of Metformin for 2 weeks then if tolerated, start Wellbutrin. Restart Zantac as directed decrease caffeine intake. Reminded about preventive health physical.  Return in about 3 months (around 03/28/2016) for recheck.

## 2016-03-24 ENCOUNTER — Ambulatory Visit: Payer: BC Managed Care – PPO | Admitting: Nurse Practitioner

## 2016-04-14 ENCOUNTER — Ambulatory Visit (INDEPENDENT_AMBULATORY_CARE_PROVIDER_SITE_OTHER): Payer: BC Managed Care – PPO | Admitting: Nurse Practitioner

## 2016-04-14 ENCOUNTER — Encounter: Payer: Self-pay | Admitting: Nurse Practitioner

## 2016-04-14 VITALS — BP 118/76 | Temp 98.4°F | Ht 66.0 in | Wt 205.4 lb

## 2016-04-14 DIAGNOSIS — J0120 Acute ethmoidal sinusitis, unspecified: Secondary | ICD-10-CM | POA: Diagnosis not present

## 2016-04-14 DIAGNOSIS — H532 Diplopia: Secondary | ICD-10-CM

## 2016-04-14 MED ORDER — BUPROPION HCL ER (XL) 150 MG PO TB24: 150.0000 mg | ORAL_TABLET | Freq: Every day | ORAL | 5 refills | Status: DC

## 2016-04-14 MED ORDER — LEVOFLOXACIN 500 MG PO TABS: 500.0000 mg | ORAL_TABLET | Freq: Every day | ORAL | 0 refills | Status: DC

## 2016-04-14 NOTE — Patient Instructions (Signed)
Restart Flonase as directed Restart antihistamine

## 2016-04-15 ENCOUNTER — Encounter: Payer: Self-pay | Admitting: Nurse Practitioner

## 2016-04-15 NOTE — Progress Notes (Signed)
Subjective:  Presents for c/o left sided ethmoid area headache for the past 2 weeks. Also having blurred vision with some double vision in the left eye. Has been to optometrist twice. Exam normal. No glaucoma. Left ear pain. No cough. No fever. Had a similar problem a few months ago, visual problems resolved after treated for sinus. Has checked blood sugar during these episodes which have been normal. No numbness or weakness of the arms or legs. No difficulty speaking or swallowing.   Objective:   BP 118/76   Temp 98.4 F (36.9 C) (Oral)   Ht 5\' 6"  (1.676 m)   Wt 205 lb 6 oz (93.2 kg)   BMI 33.15 kg/m  NAD. Alert, oriented. TMs retracted, no erythema. Pharynx injected with PND noted. Neck supple with mild anterior adenopathy. Lungs clear. Heart RRR. Allergic shiners noted, more on the left.   Assessment: Acute ethmoidal sinusitis, recurrence not specified  Diplopia  Plan:  Meds ordered this encounter  Medications  . levofloxacin (LEVAQUIN) 500 MG tablet    Sig: Take 1 tablet (500 mg total) by mouth daily.    Dispense:  10 tablet    Refill:  0    Order Specific Question:   Supervising Provider    Answer:   Merlyn AlbertLUKING, WILLIAM S [[id number]]  . buPROPion (WELLBUTRIN XL) 150 MG 24 hr tablet    Sig: Take 1 tablet (150 mg total) by mouth daily.    Dispense:  30 tablet    Refill:  5    Order Specific Question:   Supervising Provider    Answer:   Merlyn AlbertLUKING, WILLIAM S [[id number]]   Restart Flonase as directed Restart antihistamine Warning signs reviewed. Call back in 7-10 days if no improvement, sooner if worse. May need scan at that time.

## 2016-05-02 ENCOUNTER — Telehealth: Payer: Self-pay | Admitting: Nurse Practitioner

## 2016-05-02 DIAGNOSIS — R51 Headache: Principal | ICD-10-CM

## 2016-05-02 DIAGNOSIS — R519 Headache, unspecified: Secondary | ICD-10-CM

## 2016-05-02 NOTE — Telephone Encounter (Signed)
Nurses: please order MRI of the brain without contrast. See my last note. I am off until Thursday but feel free to text me if any problems.

## 2016-05-02 NOTE — Telephone Encounter (Signed)
Patient seen on 04/14/16.  She states that the antibiotics did not help her eyes.  It helped the sinus infection, but says her eyes are still having issues and would like to go ahead with the MRI.

## 2016-05-02 NOTE — Telephone Encounter (Signed)
Spoke with patient and patient stated that symptoms are no better. Still has the pressure behind eyes. She would like to proceed with the MRI. Please advise?

## 2016-05-05 NOTE — Telephone Encounter (Addendum)
Left message to return call (to see what day and time would be good to schedule MRI)( MRI order in EPIC)

## 2016-05-06 NOTE — Telephone Encounter (Signed)
Scheduled for Sept 7, 2017 at 5 pm. Patient notified.

## 2016-05-15 ENCOUNTER — Ambulatory Visit (HOSPITAL_COMMUNITY): Payer: BC Managed Care – PPO

## 2016-05-21 ENCOUNTER — Telehealth: Payer: Self-pay | Admitting: Nurse Practitioner

## 2016-05-21 DIAGNOSIS — R7301 Impaired fasting glucose: Secondary | ICD-10-CM

## 2016-05-21 NOTE — Telephone Encounter (Signed)
Hold for A1C results

## 2016-05-21 NOTE — Telephone Encounter (Signed)
Please review Brain MRI without contrast results in yellow folder.

## 2016-05-21 NOTE — Telephone Encounter (Signed)
MRI normal. Sinuses clear. Is she still having symptoms? Recommend A1C done through lab if she has not had one done recently just to check her sugar.

## 2016-05-21 NOTE — Telephone Encounter (Signed)
Notified patient MRI normal. Sinuses clear. Recommend A1C done through lab if she has not had one done recently just to check her sugar. Lab in system. Patient states she is still having double vision and eye pain.

## 2016-08-15 ENCOUNTER — Ambulatory Visit (INDEPENDENT_AMBULATORY_CARE_PROVIDER_SITE_OTHER): Payer: BC Managed Care – PPO | Admitting: Nurse Practitioner

## 2016-08-15 VITALS — BP 142/88 | Temp 98.1°F | Wt 210.8 lb

## 2016-08-15 DIAGNOSIS — F419 Anxiety disorder, unspecified: Secondary | ICD-10-CM

## 2016-08-15 DIAGNOSIS — F329 Major depressive disorder, single episode, unspecified: Secondary | ICD-10-CM

## 2016-08-15 DIAGNOSIS — F418 Other specified anxiety disorders: Secondary | ICD-10-CM | POA: Diagnosis not present

## 2016-08-15 DIAGNOSIS — R7301 Impaired fasting glucose: Secondary | ICD-10-CM | POA: Diagnosis not present

## 2016-08-15 DIAGNOSIS — E8881 Metabolic syndrome: Secondary | ICD-10-CM | POA: Diagnosis not present

## 2016-08-15 LAB — POCT GLYCOSYLATED HEMOGLOBIN (HGB A1C): HEMOGLOBIN A1C: 5.3

## 2016-08-15 MED ORDER — BUPROPION HCL ER (XL) 150 MG PO TB24: 150.0000 mg | ORAL_TABLET | Freq: Every day | ORAL | 5 refills | Status: DC

## 2016-08-15 MED ORDER — CLONAZEPAM 0.5 MG PO TABS: 0.5000 mg | ORAL_TABLET | Freq: Two times a day (BID) | ORAL | 0 refills | Status: DC | PRN

## 2016-08-16 ENCOUNTER — Encounter: Payer: Self-pay | Admitting: Nurse Practitioner

## 2016-08-16 NOTE — Progress Notes (Signed)
Subjective:  Presents for routine follow-up. Has not done well with her diet or activity lately. Has been under tremendous stress related to personal family issues regarding her son. No chest pain/ischemic type pain or shortness of breath. Difficulty sleeping and some emotional lability. No suicidal or homicidal thoughts or ideation. Her eye symptoms mentioned at previous visit has improved. Has had an exam with ophthalmology which was normal.  Objective:   BP (!) 142/88 (BP Location: Right Arm, Patient Position: Sitting, Cuff Size: Normal)   Temp 98.1 F (36.7 C)   Wt 210 lb 12.8 oz (95.6 kg)   BMI 34.02 kg/m  NAD. Alert, oriented. Crying a few times during office visit. Thoughts logical coherent and relevant. Dressed appropriately. Making good eye contact. Lungs clear. Heart regular rate rhythm. Results for orders placed or performed in visit on 08/15/16  POCT glycosylated hemoglobin (Hb A1C)  Result Value Ref Range   Hemoglobin A1C 5.3      Assessment:  Problem List Items Addressed This Visit      Endocrine   Impaired fasting glucose - Primary   Relevant Orders   POCT glycosylated hemoglobin (Hb A1C) (Completed)     Other   Anxiety and depression (Chronic)   Insulin resistance   Relevant Orders   POCT glycosylated hemoglobin (Hb A1C) (Completed)     Plan:  Meds ordered this encounter  Medications  . clonazePAM (KLONOPIN) 0.5 MG tablet    Sig: Take 1 tablet (0.5 mg total) by mouth 2 (two) times daily as needed for anxiety.    Dispense:  30 tablet    Refill:  0    Order Specific Question:   Supervising Provider    Answer:   Merlyn AlbertLUKING, WILLIAM S [[id number]]  . buPROPion (WELLBUTRIN XL) 150 MG 24 hr tablet    Sig: Take 1 tablet (150 mg total) by mouth daily.    Dispense:  30 tablet    Refill:  5    Order Specific Question:   Supervising Provider    Answer:   Merlyn AlbertLUKING, WILLIAM S [[id number]]   Discussed options. Will add Klonopin to regimen. Use sparingly. Recommend patient consider  physical and routine lab work. Return in about 3 months (around 11/13/2016) for recheck. Call back sooner if no improvement in her anxiety. Encourage patient to increase activity and work on weight loss.

## 2016-11-12 ENCOUNTER — Ambulatory Visit: Payer: BC Managed Care – PPO | Admitting: Nurse Practitioner

## 2016-12-26 ENCOUNTER — Telehealth: Payer: Self-pay | Admitting: Nurse Practitioner

## 2016-12-26 ENCOUNTER — Other Ambulatory Visit: Payer: Self-pay | Admitting: Nurse Practitioner

## 2016-12-26 MED ORDER — ALBUTEROL SULFATE HFA 108 (90 BASE) MCG/ACT IN AERS: 2.0000 | INHALATION_SPRAY | RESPIRATORY_TRACT | 2 refills | Status: DC | PRN

## 2016-12-26 NOTE — Telephone Encounter (Signed)
Patient made an appt for next week but she is wanting to get a refill on her inhaler.  She doesn't have to use it very often but her allergies are really bothering her this year and the inhaler has expired.  Wanted to see if we would call in rx for: albuterol (PROVENTIL HFA;VENTOLIN HFA) 108 (90 BASE) MCG/ACT inhaler   until she can see [name] next week.  Uses [facility].

## 2016-12-26 NOTE — Telephone Encounter (Signed)
done

## 2016-12-26 NOTE — Telephone Encounter (Signed)
May we refill inhaler?

## 2017-01-01 ENCOUNTER — Telehealth: Payer: Self-pay | Admitting: Family Medicine

## 2017-01-01 ENCOUNTER — Encounter: Payer: Self-pay | Admitting: Family Medicine

## 2017-01-01 ENCOUNTER — Encounter: Payer: Self-pay | Admitting: Nurse Practitioner

## 2017-01-01 ENCOUNTER — Ambulatory Visit (INDEPENDENT_AMBULATORY_CARE_PROVIDER_SITE_OTHER): Payer: BC Managed Care – PPO | Admitting: Nurse Practitioner

## 2017-01-01 VITALS — BP 124/82 | HR 87 | Temp 98.4°F | Ht 66.0 in | Wt 213.4 lb

## 2017-01-01 DIAGNOSIS — J0100 Acute maxillary sinusitis, unspecified: Secondary | ICD-10-CM

## 2017-01-01 DIAGNOSIS — J4521 Mild intermittent asthma with (acute) exacerbation: Secondary | ICD-10-CM | POA: Diagnosis not present

## 2017-01-01 MED ORDER — PREDNISONE 20 MG PO TABS: ORAL_TABLET | ORAL | 0 refills | Status: DC

## 2017-01-01 MED ORDER — AZITHROMYCIN 250 MG PO TABS: ORAL_TABLET | ORAL | 0 refills | Status: DC

## 2017-01-01 NOTE — Telephone Encounter (Signed)
Message for Sara Powers- patient was just seen and forgot to tell carolyn she needed refill on maxalt 10 mg called into [facility]

## 2017-01-01 NOTE — Patient Instructions (Signed)
Spray saline spray in the nose followed by small amount of Neosporin

## 2017-01-02 ENCOUNTER — Encounter: Payer: Self-pay | Admitting: Nurse Practitioner

## 2017-01-02 ENCOUNTER — Other Ambulatory Visit: Payer: Self-pay | Admitting: Nurse Practitioner

## 2017-01-02 MED ORDER — RIZATRIPTAN BENZOATE 10 MG PO TBDP: 10.0000 mg | ORAL_TABLET | ORAL | 0 refills | Status: DC | PRN

## 2017-01-02 NOTE — Telephone Encounter (Signed)
done

## 2017-01-02 NOTE — Progress Notes (Signed)
Subjective:  Presents for complaints of a flareup of her allergy symptoms. Began almost a week ago after exposure to pollen. Typically has problems during season change. Using her albuterol inhaler 2 puffs once a day which controls her symptoms. Also takes daily antihistamine. Has developed a maxillary area sinus headache over the past few days and producing green mucus. Ear pressure. No sore throat. No fever. Has not used her inhaler today.  Objective:   BP 124/82   Pulse 87   Temp 98.4 F (36.9 C) (Oral)   Ht  (1.676 m)   Wt 213 lb 6 oz (96.8 kg)   SpO2 97%   BMI 34.44 kg/m  NAD. Alert, oriented. TMs clear effusion, no erythema. Pharynx injected with PND noted. Neck supple with mild soft anterior adenopathy. Lungs clear. Heart regular rate rhythm.  Assessment:   Problem List Items Addressed This Visit      Respiratory   Asthma - Primary   Relevant Medications   predniSONE (DELTASONE) 20 MG tablet    Other Visit Diagnoses    Acute non-recurrent maxillary sinusitis       Relevant Medications   predniSONE (DELTASONE) 20 MG tablet   azithromycin (ZITHROMAX Z-PAK) 250 MG tablet       Plan:   Meds ordered this encounter  Medications  . predniSONE (DELTASONE) 20 MG tablet    Sig: 3 po qd x 3 d then 2 po qd x 3 d then 1 po qd x 2 d    Dispense:  17 tablet    Refill:  0    Order Specific Question:   Supervising Provider    Answer:   Merlyn Albert [[id number]]  . azithromycin (ZITHROMAX Z-PAK) 250 MG tablet    Sig: Take 2 tablets (500 mg) on  Day 1,  followed by 1 tablet (250 mg) once daily on Days 2 through 5.    Dispense:  6 each    Refill:  0    Order Specific Question:   Supervising Provider    Answer:   Riccardo Dubin   Given prescription for prednisone to start if no relief of headache or if wheezing worsens/persists. OTC meds as directed for congestion. Callback in 7-10 days if no improvement, sooner if worse. Continue albuterol as directed.

## 2017-03-26 ENCOUNTER — Ambulatory Visit (INDEPENDENT_AMBULATORY_CARE_PROVIDER_SITE_OTHER): Payer: BC Managed Care – PPO | Admitting: Nurse Practitioner

## 2017-03-26 ENCOUNTER — Encounter: Payer: Self-pay | Admitting: Nurse Practitioner

## 2017-03-26 VITALS — BP 122/88 | Ht 66.0 in | Wt 213.1 lb

## 2017-03-26 DIAGNOSIS — L719 Rosacea, unspecified: Secondary | ICD-10-CM | POA: Diagnosis not present

## 2017-03-26 DIAGNOSIS — R7301 Impaired fasting glucose: Secondary | ICD-10-CM | POA: Diagnosis not present

## 2017-03-26 DIAGNOSIS — F329 Major depressive disorder, single episode, unspecified: Secondary | ICD-10-CM | POA: Diagnosis not present

## 2017-03-26 DIAGNOSIS — E8881 Metabolic syndrome: Secondary | ICD-10-CM

## 2017-03-26 DIAGNOSIS — F419 Anxiety disorder, unspecified: Secondary | ICD-10-CM

## 2017-03-26 DIAGNOSIS — B351 Tinea unguium: Secondary | ICD-10-CM | POA: Diagnosis not present

## 2017-03-26 DIAGNOSIS — F32A Depression, unspecified: Secondary | ICD-10-CM

## 2017-03-26 LAB — POCT GLYCOSYLATED HEMOGLOBIN (HGB A1C): HEMOGLOBIN A1C: 5

## 2017-03-26 MED ORDER — CICLOPIROX 8 % EX SOLN: Freq: Every day | CUTANEOUS | 2 refills | Status: DC

## 2017-03-26 NOTE — Patient Instructions (Addendum)
[facility]  Kerasol Tea Tree oil vicks vapor rub

## 2017-03-27 ENCOUNTER — Encounter: Payer: Self-pay | Admitting: Nurse Practitioner

## 2017-03-27 DIAGNOSIS — L719 Rosacea, unspecified: Secondary | ICD-10-CM | POA: Insufficient documentation

## 2017-03-27 MED ORDER — METRONIDAZOLE 0.75 % EX CREA: TOPICAL_CREAM | Freq: Two times a day (BID) | CUTANEOUS | 0 refills | Status: DC

## 2017-03-27 NOTE — Progress Notes (Signed)
Subjective:  Presents for recheck on insulin and sugar. Minimal activity. Has been under more stress lately. Is interested in getting mental health counseling. Denies suicidal or homicidal thoughts or ideation. Also has some chronic recurrent redness around the nose, nontender. Nonpruritic. Some discoloration noted under her right great toenail.  Objective:   BP 122/88   Ht 5\' 6"  (1.676 m)   Wt 213 lb 0.8 oz (96.6 kg)   BMI 34.39 kg/m  NAD. Alert, oriented. Mildly anxious affect. Lungs clear. Heart regular rate rhythm. Results for orders placed or performed in visit on 03/26/17  POCT glycosylated hemoglobin (Hb A1C)  Result Value Ref Range   Hemoglobin A1C 5.0    Mild separation from the nailbed noted at the right great toenail with white material under the nail consistent with a fungal infection. Faint erythema noted along the end of the nose with a small amount around the sides of the nose. Minimal papules noted.  Assessment:   Problem List Items Addressed This Visit      Endocrine   Impaired fasting glucose     Musculoskeletal and Integument   Rosacea     Other   Anxiety and depression (Chronic)   Insulin resistance - Primary   Relevant Orders   POCT glycosylated hemoglobin (Hb A1C) (Completed)    Other Visit Diagnoses    Fungal toenail infection       Relevant Medications   ciclopirox (PENLAC) 8 % solution       Plan:   Meds ordered this encounter  Medications  . ciclopirox (PENLAC) 8 % solution    Sig: Apply topically at bedtime. Apply over nail and surrounding skin. Apply daily over previous coat. After seven (7) days, may remove with alcohol and continue cycle.    Dispense:  1 Bottle    Refill:  2    Order Specific Question:   Supervising Provider    Answer:   Merlyn AlbertLUKING, WILLIAM S [[id number]]  . metroNIDAZOLE (METROCREAM) 0.75 % cream    Sig: Apply topically 2 (two) times daily. To facial rash prn    Dispense:  45 g    Refill:  0    Order Specific Question:    Supervising Provider    Answer:   Merlyn AlbertLUKING, WILLIAM S [[id number]]   Discussed importance of regular activity and healthy diet. Encouraged continued weight loss efforts. Patient stopped bupropion since it did not seem to be helping her symptoms. Patient to check with her insurance to see if can behavioral health is on their list of preferred providers. To make her own appointment with mental health counselor. Call back if we can be of assistance. Return in about 6 months (around 09/26/2017) for routine follow up.

## 2017-04-29 ENCOUNTER — Telehealth: Payer: Self-pay | Admitting: *Deleted

## 2017-04-29 NOTE — Telephone Encounter (Signed)
Fax from [employer]. Request for coverage for ciclopirox topical solution 8 % was denied because she did not have a test to confirm nail fungus and has not tried at least one oral medicine first. See letter in your folder.

## 2017-05-01 NOTE — Telephone Encounter (Signed)
Spoke with patient and informed her per Dr.Scott Luking- You need to do the fungal culture. We cannot prescribe a medication that runs a small theoretic risk of liver damage without for certain knowing that there is fungus is going on. Patient verbalized understanding and stated that she will come by and pick up a sterile cup to collect clipping of finger nail to bring into [place].

## 2017-05-01 NOTE — Telephone Encounter (Signed)
She needs to do the fungal culture. We cannot prescribed a medication that runs small theoretic risk of liver damage without for certain knowing that there is fungus going on.

## 2017-05-01 NOTE — Telephone Encounter (Signed)
Spoke with patient and informed her per Dr.Scott Luking- places multiple stipulations on this medication. He will require a fungal nail culture. It will also require her trying any oral medicine first and if she fails that then they will allow for the topical. Oral medicines are generally safe but can rarely cause liver damage. If the patient still once to move forward regarding this then the next step would be for her to provide multiple clippings of the infected nail in a specimen cup that can be sent for culture. It should be explained to the patient that culture can take several weeks to get back. Then once that result is back then can prescribed the oral medicine. Patient verbalized understanding and stated that she will prefer just to have a oral medication called in and hold off on doing the fungal culture. Please advise?

## 2017-05-01 NOTE — Telephone Encounter (Signed)
Please inform the patient that her insurance places multiple stipulations on this medication. He will require a fungal nail culture. It will also require her trying any oral medicine first and if she fails that then they will allow for the topical. Oral medicines are generally safe but can rarely cause liver damage. If the patient still once to move forward regarding this then the next step would be for her to provide multiple clippings of the infected nail in a specimen cup that can be sent for culture. It should be explained to the patient that culture can take several weeks to get back. Then once that result is back then can prescribed the oral medicine.

## 2017-05-01 NOTE — Telephone Encounter (Signed)
Left message return call 05/01/17 

## 2017-07-14 ENCOUNTER — Encounter: Payer: Self-pay | Admitting: Family Medicine

## 2017-07-14 ENCOUNTER — Ambulatory Visit: Payer: BC Managed Care – PPO | Admitting: Family Medicine

## 2017-07-14 VITALS — BP 140/92 | Ht 66.0 in | Wt 205.0 lb

## 2017-07-14 DIAGNOSIS — F411 Generalized anxiety disorder: Secondary | ICD-10-CM

## 2017-07-14 DIAGNOSIS — G43809 Other migraine, not intractable, without status migrainosus: Secondary | ICD-10-CM

## 2017-07-14 DIAGNOSIS — L719 Rosacea, unspecified: Secondary | ICD-10-CM

## 2017-07-14 DIAGNOSIS — G43909 Migraine, unspecified, not intractable, without status migrainosus: Secondary | ICD-10-CM | POA: Insufficient documentation

## 2017-07-14 MED ORDER — CLONAZEPAM 0.5 MG PO TABS: 0.5000 mg | ORAL_TABLET | Freq: Two times a day (BID) | ORAL | 5 refills | Status: DC | PRN

## 2017-07-14 MED ORDER — RIZATRIPTAN BENZOATE 10 MG PO TBDP: 10.0000 mg | ORAL_TABLET | ORAL | 5 refills | Status: DC | PRN

## 2017-07-14 NOTE — Progress Notes (Signed)
   Subjective:    Patient ID: Sara Powers, female    DOB: 05/31/1966, 51 y.o.   MRN: [id number]  HPI patient is here today to follow up on her Anxiety and depression. She is on Clonazepam 1 po BID prn ,but needs a refill.  Depression screen PHQ 2/9 07/14/2017  Decreased Interest 1  Down, Depressed, Hopeless 0  PHQ - 2 Score 1  Tired, decreased energy 1  Change in appetite 1  Feeling bad or failure about yourself  0  Trouble concentrating 0  Moving slowly or fidgety/restless 0  Suicidal thoughts 0  Difficult doing work/chores Somewhat difficult   Intermittent migraine issues denies any other particular troubles   Review of Systems  Constitutional: Negative for activity change, fatigue and fever.  HENT: Negative for congestion.   Respiratory: Negative for cough, chest tightness and shortness of breath.   Cardiovascular: Negative for chest pain and leg swelling.  Gastrointestinal: Negative for abdominal pain.  Skin: Negative for color change.  Neurological: Negative for headaches.  Psychiatric/Behavioral: Negative for behavioral problems.       Objective:   Physical Exam  Constitutional: She appears well-nourished. No distress.  HENT:  Head: Normocephalic.  Right Ear: External ear normal.  Left Ear: External ear normal.  Eyes: Right eye exhibits no discharge. Left eye exhibits no discharge.  Neck: No tracheal deviation present.  Cardiovascular: Normal rate, regular rhythm and normal heart sounds.  No murmur heard. Pulmonary/Chest: Effort normal and breath sounds normal. No respiratory distress. She has no wheezes. She has no rales.  Musculoskeletal: She exhibits no edema.  Lymphadenopathy:    She has no cervical adenopathy.  Neurological: She is alert.  Psychiatric: Her behavior is normal.  Vitals reviewed.         Assessment & Plan:  The patient does not feel she is depressed She would like to use more medicine only when necessary she only takes a half a  pill at a time She understands the importance of keeping up with female health checkups she was advised to get a colonoscopy at age 51 Migraines are stable refill of medication per patient request

## 2017-07-14 NOTE — Patient Instructions (Signed)
DASH Eating Plan DASH stands for "Dietary Approaches to Stop Hypertension." The DASH eating plan is a healthy eating plan that has been shown to reduce high blood pressure (hypertension). It may also reduce your risk for type 2 diabetes, heart disease, and stroke. The DASH eating plan may also help with weight loss. What are tips for following this plan? General guidelines  Avoid eating more than 2,300 mg (milligrams) of salt (sodium) a day. If you have hypertension, you may need to reduce your sodium intake to 1,500 mg a day.  Limit alcohol intake to no more than 1 drink a day for nonpregnant women and 2 drinks a day for men. One drink equals 12 oz of beer, 5 oz of wine, or 1 oz of hard liquor.  Work with your health care provider to maintain a healthy body weight or to lose weight. Ask what an ideal weight is for you.  Get at least 30 minutes of exercise that causes your heart to beat faster (aerobic exercise) most days of the week. Activities may include walking, swimming, or biking.  Work with your health care provider or diet and nutrition specialist (dietitian) to adjust your eating plan to your individual calorie needs. Reading food labels  Check food labels for the amount of sodium per serving. Choose foods with less than 5 percent of the Daily Value of sodium. Generally, foods with less than 300 mg of sodium per serving fit into this eating plan.  To find whole grains, look for the word "whole" as the first word in the ingredient list. Shopping  Buy products labeled as "low-sodium" or "no salt added."  Buy fresh foods. Avoid canned foods and premade or frozen meals. Cooking  Avoid adding salt when cooking. Use salt-free seasonings or herbs instead of table salt or sea salt. Check with your health care provider or pharmacist before using salt substitutes.  Do not fry foods. Cook foods using healthy methods such as baking, boiling, grilling, and broiling instead.  Cook with  heart-healthy oils, such as olive, canola, soybean, or sunflower oil. Meal planning   Eat a balanced diet that includes: ? 5 or more servings of fruits and vegetables each day. At each meal, try to fill half of your plate with fruits and vegetables. ? Up to 6-8 servings of whole grains each day. ? Less than 6 oz of lean meat, poultry, or fish each day. A 3-oz serving of meat is about the same size as a deck of cards. One egg equals 1 oz. ? 2 servings of low-fat dairy each day. ? A serving of nuts, seeds, or beans 5 times each week. ? Heart-healthy fats. Healthy fats called Omega-3 fatty acids are found in foods such as flaxseeds and coldwater fish, like sardines, salmon, and mackerel.  Limit how much you eat of the following: ? Canned or prepackaged foods. ? Food that is high in trans fat, such as fried foods. ? Food that is high in saturated fat, such as fatty meat. ? Sweets, desserts, sugary drinks, and other foods with added sugar. ? Full-fat dairy products.  Do not salt foods before eating.  Try to eat at least 2 vegetarian meals each week.  Eat more home-cooked food and less restaurant, buffet, and fast food.  When eating at a restaurant, ask that your food be prepared with less salt or no salt, if possible. What foods are recommended? The items listed may not be a complete list. Talk with your dietitian about what   dietary choices are best for you. Grains Whole-grain or whole-wheat bread. Whole-grain or whole-wheat pasta. Brown rice. Oatmeal. Quinoa. Bulgur. Whole-grain and low-sodium cereals. Pita bread. Low-fat, low-sodium crackers. Whole-wheat flour tortillas. Vegetables Fresh or frozen vegetables (raw, steamed, roasted, or grilled). Low-sodium or reduced-sodium tomato and vegetable juice. Low-sodium or reduced-sodium tomato sauce and tomato paste. Low-sodium or reduced-sodium canned vegetables. Fruits All fresh, dried, or frozen fruit. Canned fruit in natural juice (without  added sugar). Meat and other protein foods Skinless chicken or turkey. Ground chicken or turkey. Pork with fat trimmed off. Fish and seafood. Egg whites. Dried beans, peas, or lentils. Unsalted nuts, nut butters, and seeds. Unsalted canned beans. Lean cuts of beef with fat trimmed off. Low-sodium, lean deli meat. Dairy Low-fat (1%) or fat-free (skim) milk. Fat-free, low-fat, or reduced-fat cheeses. Nonfat, low-sodium ricotta or cottage cheese. Low-fat or nonfat yogurt. Low-fat, low-sodium cheese. Fats and oils Soft margarine without trans fats. Vegetable oil. Low-fat, reduced-fat, or light mayonnaise and salad dressings (reduced-sodium). Canola, safflower, olive, soybean, and sunflower oils. Avocado. Seasoning and other foods Herbs. Spices. Seasoning mixes without salt. Unsalted popcorn and pretzels. Fat-free sweets. What foods are not recommended? The items listed may not be a complete list. Talk with your dietitian about what dietary choices are best for you. Grains Baked goods made with fat, such as croissants, muffins, or some breads. Dry pasta or rice meal packs. Vegetables Creamed or fried vegetables. Vegetables in a cheese sauce. Regular canned vegetables (not low-sodium or reduced-sodium). Regular canned tomato sauce and paste (not low-sodium or reduced-sodium). Regular tomato and vegetable juice (not low-sodium or reduced-sodium). Pickles. Olives. Fruits Canned fruit in a light or heavy syrup. Fried fruit. Fruit in cream or butter sauce. Meat and other protein foods Fatty cuts of meat. Ribs. Fried meat. Bacon. Sausage. Bologna and other processed lunch meats. Salami. Fatback. Hotdogs. Bratwurst. Salted nuts and seeds. Canned beans with added salt. Canned or smoked fish. Whole eggs or egg yolks. Chicken or turkey with skin. Dairy Whole or 2% milk, cream, and half-and-half. Whole or full-fat cream cheese. Whole-fat or sweetened yogurt. Full-fat cheese. Nondairy creamers. Whipped toppings.  Processed cheese and cheese spreads. Fats and oils Butter. Stick margarine. Lard. Shortening. Ghee. Bacon fat. Tropical oils, such as coconut, palm kernel, or palm oil. Seasoning and other foods Salted popcorn and pretzels. Onion salt, garlic salt, seasoned salt, table salt, and sea salt. Worcestershire sauce. Tartar sauce. Barbecue sauce. Teriyaki sauce. Soy sauce, including reduced-sodium. Steak sauce. Canned and packaged gravies. Fish sauce. Oyster sauce. Cocktail sauce. Horseradish that you find on the shelf. Ketchup. Mustard. Meat flavorings and tenderizers. Bouillon cubes. Hot sauce and Tabasco sauce. Premade or packaged marinades. Premade or packaged taco seasonings. Relishes. Regular salad dressings. Where to find more information:  National Heart, Lung, and Blood Institute: www.nhlbi.nih.gov  American Heart Association: www.heart.org Summary  The DASH eating plan is a healthy eating plan that has been shown to reduce high blood pressure (hypertension). It may also reduce your risk for type 2 diabetes, heart disease, and stroke.  With the DASH eating plan, you should limit salt (sodium) intake to 2,300 mg a day. If you have hypertension, you may need to reduce your sodium intake to 1,500 mg a day.  When on the DASH eating plan, aim to eat more fresh fruits and vegetables, whole grains, lean proteins, low-fat dairy, and heart-healthy fats.  Work with your health care provider or diet and nutrition specialist (dietitian) to adjust your eating plan to your individual   calorie needs. This information is not intended to replace advice given to you by your health care provider. Make sure you discuss any questions you have with your health care provider. Document Released: 08/14/2011 Document Revised: 08/18/2016 Document Reviewed: 08/18/2016 Elsevier Interactive Patient Education  2017 [employer] Inc.  

## 2017-07-20 ENCOUNTER — Encounter: Payer: Self-pay | Admitting: Family Medicine

## 2017-10-15 ENCOUNTER — Encounter: Payer: Self-pay | Admitting: Family Medicine

## 2017-10-15 ENCOUNTER — Ambulatory Visit: Payer: BC Managed Care – PPO | Admitting: Family Medicine

## 2017-10-15 VITALS — BP 118/88 | Temp 98.1°F | Ht 66.0 in | Wt 209.4 lb

## 2017-10-15 DIAGNOSIS — J019 Acute sinusitis, unspecified: Secondary | ICD-10-CM | POA: Diagnosis not present

## 2017-10-15 DIAGNOSIS — B9689 Other specified bacterial agents as the cause of diseases classified elsewhere: Secondary | ICD-10-CM | POA: Diagnosis not present

## 2017-10-15 MED ORDER — LEVOFLOXACIN 500 MG PO TABS: 500.0000 mg | ORAL_TABLET | Freq: Every day | ORAL | 0 refills | Status: DC

## 2017-10-15 NOTE — Progress Notes (Signed)
   Subjective:    Patient ID: Sara Powers, female    DOB: 04/13/1966, 52 y.o.   MRN: [id number]  Sore Throat   The current episode started 1 to 4 weeks ago. The maximum temperature recorded prior to her arrival was 101 - 101.9 F. The fever has been present for less than 1 day. Associated symptoms include congestion, coughing, ear pain and headaches. Pertinent negatives include no shortness of breath. Associated symptoms comments: fever. She has tried acetaminophen (mucinex) for the symptoms.    Pt stated she went to [facility] on Sunday and they tested her for strep which was negative.  Patient now has increased cough congestion also had some fever last night and this morning denies nausea vomiting diarrhea  Review of Systems  Constitutional: Negative for activity change and fever.  HENT: Positive for congestion, ear pain and rhinorrhea.   Eyes: Negative for discharge.  Respiratory: Positive for cough. Negative for shortness of breath and wheezing.   Cardiovascular: Negative for chest pain.  Neurological: Positive for headaches.       Objective:   Physical Exam  Constitutional: She appears well-developed.  HENT:  Head: Normocephalic.  Right Ear: External ear normal.  Left Ear: External ear normal.  Nose: Nose normal.  Mouth/Throat: Oropharynx is clear and moist. No oropharyngeal exudate.  Eyes: Right eye exhibits no discharge. Left eye exhibits no discharge.  Neck: Neck supple. No tracheal deviation present.  Cardiovascular: Normal rate and normal heart sounds.  No murmur heard. Pulmonary/Chest: Effort normal and breath sounds normal. She has no wheezes. She has no rales.  Lymphadenopathy:    She has no cervical adenopathy.  Skin: Skin is warm and dry.  Nursing note and vitals reviewed.         Assessment & Plan:  More than likely viral syndrome with secondary sinusitis Antibiotics prescribed warning signs discussed Follow-up for progressive troubles or  worse. No sign of pneumonia no need for x-rays or lab work currently but the patient does not get better as expected she is to call and follow-up

## 2017-10-19 ENCOUNTER — Telehealth: Payer: Self-pay | Admitting: Family Medicine

## 2017-10-19 MED ORDER — DOXYCYCLINE HYCLATE 100 MG PO TABS: 100.0000 mg | ORAL_TABLET | Freq: Two times a day (BID) | ORAL | 0 refills | Status: DC

## 2017-10-19 NOTE — Telephone Encounter (Signed)
Prescription sent electronically to pharmacy. Patient notified. 

## 2017-10-19 NOTE — Telephone Encounter (Signed)
Patient was prescribed levaquin on 10/15/17 by Dr. Lorin PicketScott.  She said this medication is making her sick on her stomach, and would like another antibiotic called in.   Robbie LisBelmont

## 2017-10-19 NOTE — Telephone Encounter (Signed)
Discontinue Levaquin due to side effects.  Recommend doxycycline 100 mg once twice daily 10 days with snack and glass of water

## 2017-10-21 ENCOUNTER — Encounter: Payer: Self-pay | Admitting: Nurse Practitioner

## 2017-10-21 ENCOUNTER — Ambulatory Visit: Payer: BC Managed Care – PPO | Admitting: Nurse Practitioner

## 2017-10-21 VITALS — BP 130/90 | Ht 66.0 in | Wt 212.0 lb

## 2017-10-21 DIAGNOSIS — Z79899 Other long term (current) drug therapy: Secondary | ICD-10-CM | POA: Diagnosis not present

## 2017-10-21 DIAGNOSIS — F329 Major depressive disorder, single episode, unspecified: Secondary | ICD-10-CM | POA: Diagnosis not present

## 2017-10-21 DIAGNOSIS — F419 Anxiety disorder, unspecified: Secondary | ICD-10-CM | POA: Diagnosis not present

## 2017-10-21 DIAGNOSIS — R5383 Other fatigue: Secondary | ICD-10-CM

## 2017-10-21 DIAGNOSIS — Z13220 Encounter for screening for lipoid disorders: Secondary | ICD-10-CM | POA: Diagnosis not present

## 2017-10-23 ENCOUNTER — Encounter: Payer: Self-pay | Admitting: Nurse Practitioner

## 2017-10-23 NOTE — Progress Notes (Addendum)
Subjective:  Presents to discuss her BP.  Had a recent elevation in BP. Weight stable. Continues to have stress but unchanged. Has had social isolation. Fatigue. Denies suicidal or homicidal thoughts or ideation. Is due for routine labs. No CP/ischemic type pain or SOB. No numbness or weakness of the face arms or legs. No difficulty speaking or swallowing. Limited exercise.  Dep[facility] screen [facility] 2/9 10/23/2017 07/14/2017  Decreased Interest 3 1  Down, Depressed, Hopeless 0 0  PHQ - 2 Score 3 1  Altered sleeping 2 -  Tired, decreased energy 2 1  Change in appetite 2 1  Feeling bad or failure about yourself  0 0  Trouble concentrating 3 0  Moving slowly or fidgety/restless 0 0  Suicidal thoughts 0 0  PHQ-9 Score 12 -  Difficult doing work/chores Very difficult Somewhat difficult   GAD 7 : Generalized Anxiety Score 10/23/2017 07/14/2017  Nervous, Anxious, on Edge 1 0  Control/stop worrying 1 2  Worry too much - different things 0 1  Trouble relaxing 2 1  Restless 0 0  Easily annoyed or irritable 3 2  Afraid - awful might happen 0 1  Total GAD 7 Score 7 7  Anxiety Difficulty Somewhat difficult Somewhat difficult      Objective:   BP 130/90   Ht 5\' 6"  (1.676 m)   Wt 212 lb 0.4 oz (96.2 kg)   BMI 34.22 kg/m  NAD. Alert, oriented. Lungs clear. Heart RRR. Carotids no bruits or thrills. LE: no edema.   Assessment:   Problem List Items Addressed This Visit      Other   Anxiety and depression - Primary (Chronic)    Other Visit Diagnoses    Fatigue, unspecified type       Relevant Orders   CBC with Differential/Platelet   Basic metabolic panel   VITAMIN D 25 Hydroxy (Vit-D Deficiency, Fractures)   TSH   Lipid screening       Relevant Orders   Lipid panel   High risk medication use       Relevant Orders   Hepatic function panel        Plan:  Discussed lifestyle measures affecting BP. Labs pending. Recommend patient consider daily medication for depression and anxiety.  Monitor BP outside of the office and contact us if elevated.  Return in about 3 months (around 01/18/2018) for recheck.

## 2017-10-26 ENCOUNTER — Encounter: Payer: Self-pay | Admitting: Nurse Practitioner

## 2017-10-26 ENCOUNTER — Other Ambulatory Visit: Payer: Self-pay | Admitting: Nurse Practitioner

## 2017-10-26 DIAGNOSIS — E559 Vitamin D deficiency, unspecified: Secondary | ICD-10-CM | POA: Insufficient documentation

## 2017-10-26 LAB — HEPATIC FUNCTION PANEL
ALK PHOS: 76 IU/L (ref 39–117)
ALT: 18 IU/L (ref 0–32)
AST: 14 IU/L (ref 0–40)
Albumin: 4.2 g/dL (ref 3.5–5.5)
BILIRUBIN, DIRECT: 0.14 mg/dL (ref 0.00–0.40)
Bilirubin Total: 0.6 mg/dL (ref 0.0–1.2)
Total Protein: 7 g/dL (ref 6.0–8.5)

## 2017-10-26 LAB — CBC WITH DIFFERENTIAL/PLATELET
Basophils Absolute: 0 10*3/uL (ref 0.0–0.2)
Basos: 0 %
EOS (ABSOLUTE): 0.2 10*3/uL (ref 0.0–0.4)
Eos: 3 %
Hematocrit: 40.7 % (ref 34.0–46.6)
Hemoglobin: 13.5 g/dL (ref 11.1–15.9)
IMMATURE GRANS (ABS): 0 10*3/uL (ref 0.0–0.1)
Immature Granulocytes: 0 %
LYMPHS ABS: 2.9 10*3/uL (ref 0.7–3.1)
Lymphs: 34 %
MCH: 29.6 pg (ref 26.6–33.0)
MCHC: 33.2 g/dL (ref 31.5–35.7)
MCV: 89 fL (ref 79–97)
MONOCYTES: 8 %
Monocytes Absolute: 0.7 10*3/uL (ref 0.1–0.9)
Neutrophils Absolute: 4.6 10*3/uL (ref 1.4–7.0)
Neutrophils: 55 %
PLATELETS: 388 10*3/uL — AB (ref 150–379)
RBC: 4.56 x10E6/uL (ref 3.77–5.28)
RDW: 13.1 % (ref 12.3–15.4)
WBC: 8.4 10*3/uL (ref 3.4–10.8)

## 2017-10-26 LAB — BASIC METABOLIC PANEL
BUN/Creatinine Ratio: 10 (ref 9–23)
BUN: 9 mg/dL (ref 6–24)
CALCIUM: 9.5 mg/dL (ref 8.7–10.2)
CHLORIDE: 102 mmol/L (ref 96–106)
CO2: 22 mmol/L (ref 20–29)
Creatinine, Ser: 0.87 mg/dL (ref 0.57–1.00)
GFR calc Af Amer: 90 mL/min/{1.73_m2} (ref >59)
GFR calc non Af Amer: 78 mL/min/{1.73_m2} (ref >59)
GLUCOSE: 106 mg/dL — AB (ref 65–99)
POTASSIUM: 4.5 mmol/L (ref 3.5–5.2)
Sodium: 139 mmol/L (ref 134–144)

## 2017-10-26 LAB — LIPID PANEL
CHOLESTEROL TOTAL: 188 mg/dL (ref 100–199)
Chol/HDL Ratio: 3.9 ratio (ref 0.0–4.4)
HDL: 48 mg/dL (ref >39)
LDL Calculated: 110 mg/dL — ABNORMAL HIGH (ref 0–99)
TRIGLYCERIDES: 149 mg/dL (ref 0–149)
VLDL CHOLESTEROL CAL: 30 mg/dL (ref 5–40)

## 2017-10-26 LAB — TSH: TSH: 1.83 u[IU]/mL (ref 0.450–4.500)

## 2017-10-26 LAB — VITAMIN D 25 HYDROXY (VIT D DEFICIENCY, FRACTURES): VIT D 25 HYDROXY: 12.4 ng/mL — AB (ref 30.0–100.0)

## 2017-10-26 MED ORDER — VITAMIN D (ERGOCALCIFEROL) 1.25 MG (50000 UNIT) PO CAPS: 50000.0000 [IU] | ORAL_CAPSULE | ORAL | 2 refills | Status: DC

## 2017-10-27 ENCOUNTER — Other Ambulatory Visit: Payer: Self-pay | Admitting: Nurse Practitioner

## 2017-10-27 MED ORDER — BUPROPION HCL ER (XL) 150 MG PO TB24: 150.0000 mg | ORAL_TABLET | Freq: Every day | ORAL | 2 refills | Status: DC

## 2018-02-16 ENCOUNTER — Ambulatory Visit: Payer: BC Managed Care – PPO | Admitting: Nurse Practitioner

## 2018-02-16 ENCOUNTER — Encounter: Payer: Self-pay | Admitting: Nurse Practitioner

## 2018-02-16 VITALS — BP 134/82 | HR 67 | Temp 98.2°F | Ht 66.0 in | Wt 208.0 lb

## 2018-02-16 DIAGNOSIS — J0100 Acute maxillary sinusitis, unspecified: Secondary | ICD-10-CM

## 2018-02-16 MED ORDER — CLONAZEPAM 0.5 MG PO TABS: 0.5000 mg | ORAL_TABLET | Freq: Two times a day (BID) | ORAL | 0 refills | Status: DC | PRN

## 2018-02-16 MED ORDER — DOXYCYCLINE HYCLATE 100 MG PO TABS: 100.0000 mg | ORAL_TABLET | Freq: Two times a day (BID) | ORAL | 0 refills | Status: DC

## 2018-02-16 NOTE — Progress Notes (Signed)
Subjective: Presents for complaints of sinus symptoms for the past couple weeks, has gradually gotten worse.  No fever.  Scratchy throat.  Headache underneath the left eye around her cheekbone radiating into her teeth causing pain.  No cough or wheezing.  Some ear pain.  Postnasal drainage.  Objective:   BP 134/82 (BP Location: Left Arm, Patient Position: Sitting)   Pulse 67   Temp 98.2 F (36.8 C) (Oral)   Ht 5\' 6"  (1.676 m)   Wt 208 lb (94.3 kg)   LMP 02/01/2018 (Approximate)   SpO2 98%   BMI 33.57 kg/m  NAD.  Alert, oriented.  TMs very retracted bilateral, no erythema.  Pharynx posterior erythematous with PND noted.  Neck supple with mild soft anterior adenopathy.  Lungs clear.  Heart regular rate and rhythm.  Assessment:  Acute non-recurrent maxillary sinusitis    Plan:   Meds ordered this encounter  Medications  . doxycycline (VIBRA-TABS) 100 MG tablet    Sig: Take 1 tablet (100 mg total) by mouth 2 (two) times daily.    Dispense:  20 tablet    Refill:  0    Order Specific Question:   Supervising Provider    Answer:   Merlyn AlbertLUKING, WILLIAM S [[id number]]  . clonazePAM (KLONOPIN) 0.5 MG tablet    Sig: Take 1 tablet (0.5 mg total) by mouth 2 (two) times daily as needed for anxiety.    Dispense:  40 tablet    Refill:  0    Order Specific Question:   Supervising Provider    Answer:   Merlyn AlbertLUKING, WILLIAM S [[id number]]   OTC meds as directed for congestion and cough.  Call back by the end of the week if no improvement, consider adding a prednisone taper at that time.  Patient also requesting a refill of her Klonopin which she takes very sparingly.  Still has several refills from the original prescription but these have run out. Return if symptoms worsen or fail to improve.

## 2018-05-07 ENCOUNTER — Encounter: Payer: Self-pay | Admitting: Family Medicine

## 2018-05-07 ENCOUNTER — Ambulatory Visit: Payer: BC Managed Care – PPO | Admitting: Family Medicine

## 2018-05-07 VITALS — BP 132/80 | Temp 98.4°F | Ht 66.0 in | Wt 213.0 lb

## 2018-05-07 DIAGNOSIS — G43009 Migraine without aura, not intractable, without status migrainosus: Secondary | ICD-10-CM

## 2018-05-07 MED ORDER — NEOMYCIN-POLYMYXIN-HC 3.5-10000-1 OT SOLN: OTIC | 0 refills | Status: DC

## 2018-05-07 NOTE — Progress Notes (Signed)
   Subjective:    Patient ID: Sara BraceStacey M Topp, female    DOB: 04/14/1966, 52 y.o.   MRN: [id number] Patient arrives office ostensibly for sinus infection but actually has numerous worries and concerns Sinusitis  This is a new problem. Episode onset: one week. Associated symptoms include ear pain, headaches and a sore throat. Treatments tried: ear drops.   Migraine started today. Tried rizatriptan.    Ear uncomfortable about an week ago, feels 2ater in it  Pt had bad mri from took one, negative  Gets the migr about evey couple mnths   Has had some scratchyness throat  migr always in the face and left side   Eye seemed a bit squinted   Review of Systems  HENT: Positive for ear pain and sore throat.   Neurological: Positive for headaches.       Objective:   Physical Exam  Alert and oriented, vitals reviewed and stable, NAD ENT-TM's and ext canals WNL bilat via otoscopic exam Soft palate, tonsils and post pharynx WNL via oropharyngeal exam Neck-symmetric, no masses; thyroid nonpalpable and nontender Pulmonary-no tachypnea or accessory muscle use; Clear without wheezes via auscultation Card--no abnrml murmurs, rhythm reg and rate WNL Carotid pulses symmetric, without bruits Neuro exam intact.  Slight paresthesias in left cheek to soft touch.  No focal neurological deficits otherwise.  Facial muscles completely intact.      Assessment & Plan:  Impression intermittent focal migraine left region.  Has had an MRI of brain sinuses and face.  These headaches occur sporadically.  Generally to improve with Maxalt.  Patient had wondered about relative narrowing of the eye on driver's license picture but to me appears his normal squinting.  Had worried about MS and/or stroke with the paresthesia, however this is consistent with migraine discussed with patient.  Symptom care discussed.  Consider adding ibuprofen.  If these become more frequent we will need to see headache specialist.   Patient often goes weeks without any symptoms at all  Greater than 50% of this 25 minute face to face visit was spent in counseling and discussion and coordination of care regarding the above diagnosis/diagnosies

## 2018-09-09 ENCOUNTER — Ambulatory Visit: Payer: BC Managed Care – PPO | Admitting: Family Medicine

## 2018-09-09 ENCOUNTER — Encounter: Payer: Self-pay | Admitting: Family Medicine

## 2018-09-09 VITALS — BP 122/78 | Wt 216.6 lb

## 2018-09-09 DIAGNOSIS — F321 Major depressive disorder, single episode, moderate: Secondary | ICD-10-CM

## 2018-09-09 DIAGNOSIS — J4520 Mild intermittent asthma, uncomplicated: Secondary | ICD-10-CM | POA: Diagnosis not present

## 2018-09-09 DIAGNOSIS — F419 Anxiety disorder, unspecified: Secondary | ICD-10-CM

## 2018-09-09 DIAGNOSIS — E559 Vitamin D deficiency, unspecified: Secondary | ICD-10-CM | POA: Diagnosis not present

## 2018-09-09 MED ORDER — CLONAZEPAM 0.5 MG PO TABS: ORAL_TABLET | ORAL | 0 refills | Status: DC

## 2018-09-09 MED ORDER — ALBUTEROL SULFATE HFA 108 (90 BASE) MCG/ACT IN AERS: 2.0000 | INHALATION_SPRAY | RESPIRATORY_TRACT | 2 refills | Status: DC | PRN

## 2018-09-09 NOTE — Progress Notes (Signed)
Subjective:    Patient ID: Sara Powers, female    DOB: Jul 11, 1966, 53 y.o.   MRN: [id number]  HPI Pt here today for medication check.   Pt states she is also wondering about her Vitamin D. Pt states she has had little energy and in the past Sara Powers has prescribed Vit D. Finished Vit D Rx and is hit or miss with the OTC supplement.   Came off of wellbutrin because she felt like she didn't care about anything. Has tried effexor but caused agitation and dizziness. Has tried celexa and lexapro without relief, reports thinking she has tried prozac a long time ago before as well.   Klonopin for anxiety: takes for flying and rare use for anxiety, takes 1/2 tablet for anxiety and denies drowsiness. Denies SI/HI. Feels little interest in things and little energy. Has tried setting up counseling, but has had problems with scheduling. Willing to consider counseling services again.   Reports panic attack back in October when trying to go to the gym, hasn't been back to the gym since, states symptoms resolved when she got home. Doesn't like social situations or being around lots of people. Difficulties with her mom   Reports albuterol inhaler use a couple times a month. Symptoms mostly with allergies in the spring or when around smoke or wood smoke.  Requesting refills.   Review of Systems  Constitutional: Positive for fatigue.  Respiratory: Negative for shortness of breath.   Cardiovascular: Negative for chest pain.  Psychiatric/Behavioral: Negative for suicidal ideas. The patient is nervous/anxious.        Objective:   Physical Exam Vitals signs and nursing note reviewed.  Constitutional:      General: She is not in acute distress.    Appearance: She is well-developed.  HENT:     Head: Normocephalic and atraumatic.  Neck:     Musculoskeletal: Neck supple.     Thyroid: No thyromegaly.  Cardiovascular:     Rate and Rhythm: Normal rate and regular rhythm.     Pulses: Normal pulses.    Heart sounds: Normal heart sounds. No murmur.  Pulmonary:     Effort: Pulmonary effort is normal. No respiratory distress.     Breath sounds: Normal breath sounds.  Skin:    General: Skin is warm and dry.  Neurological:     Mental Status: She is alert and oriented to person, place, and time.  Psychiatric:        Mood and Affect: Mood normal.    Depression screen [facility] 2/9 09/09/2018 10/23/2017 07/14/2017  Decreased Interest 3 3 1   Down, Depressed, Hopeless 1 0 0  PHQ - 2 Score 4 3 1   Altered sleeping 1 2 -  Tired, decreased energy 3 2 1   Change in appetite 2 2 1   Feeling bad or failure about yourself  0 0 0  Trouble concentrating 0 3 0  Moving slowly or fidgety/restless 0 0 0  Suicidal thoughts 0 0 0  PHQ-9 Score 10 12 -  Difficult doing work/chores Somewhat difficult Very difficult Somewhat difficult         Assessment & Plan:  1. Depression, major, single episode, moderate (HCC) - Plan: Ambulatory referral to Psychiatry Discussed with patient given her history of trying multiple anti-depressants without good effect, recommend her be seen by psychiatry for further evaluation and treatment to manage her medications as well as coordinate counseling services. Suspect likely agoraphobia and discussed this with patient. Pt is not suicidal, understands if  she develops SI/HI she should seek emergency care. Rare use of klonopin for anxiety, encouraged continued rare use, PMP database checked, will refill. Schedule 6 week f/u and cancel if able to see psychiatry prior to this.   2. Anxiety - Plan: Ambulatory referral to Psychiatry See #1 plan  3. Mild intermittent asthma without complication Doing well with albuterol inhaler prn, will refill.  4. Vitamin D deficiency - Plan: Vitamin D (25 hydroxy) Hx of vitamin D deficiency , will recheck level and treat if appropriate. Will notify pt of results.  Dr. Lubertha SouthSteve Luking was consulted on this case and is in agreement with the above treatment  plan.

## 2018-09-10 LAB — VITAMIN D 25 HYDROXY (VIT D DEFICIENCY, FRACTURES): Vit D, 25-Hydroxy: 25.4 ng/mL — ABNORMAL LOW (ref 30.0–100.0)

## 2018-09-16 ENCOUNTER — Encounter: Payer: Self-pay | Admitting: Family Medicine

## 2018-10-25 ENCOUNTER — Ambulatory Visit: Payer: BC Managed Care – PPO | Admitting: Family Medicine

## 2019-02-09 ENCOUNTER — Other Ambulatory Visit: Payer: Self-pay

## 2019-02-09 ENCOUNTER — Encounter: Payer: Self-pay | Admitting: Family Medicine

## 2019-02-09 ENCOUNTER — Ambulatory Visit (INDEPENDENT_AMBULATORY_CARE_PROVIDER_SITE_OTHER): Payer: BC Managed Care – PPO | Admitting: Family Medicine

## 2019-02-09 DIAGNOSIS — F410 Panic disorder [episodic paroxysmal anxiety] without agoraphobia: Secondary | ICD-10-CM | POA: Diagnosis not present

## 2019-02-09 DIAGNOSIS — F411 Generalized anxiety disorder: Secondary | ICD-10-CM | POA: Diagnosis not present

## 2019-02-09 HISTORY — DX: Panic disorder (episodic paroxysmal anxiety): F41.0

## 2019-02-09 MED ORDER — CLONAZEPAM 0.5 MG PO TABS: ORAL_TABLET | ORAL | 2 refills | Status: DC

## 2019-02-09 MED ORDER — SERTRALINE HCL 100 MG PO TABS: 100.0000 mg | ORAL_TABLET | Freq: Every day | ORAL | 3 refills | Status: DC
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
## 2019-02-10 NOTE — Progress Notes (Signed)
Referral ordered in EPIC. 

## 2019-02-10 NOTE — Addendum Note (Signed)
Addended by: Margaretha Sheffield on: 02/10/2019 08:51 AM   Modules accepted: Orders

## 2019-03-15 ENCOUNTER — Other Ambulatory Visit: Payer: Self-pay

## 2019-03-15 ENCOUNTER — Telehealth: Payer: Self-pay | Admitting: Family Medicine

## 2019-03-15 MED ORDER — RIZATRIPTAN BENZOATE 10 MG PO TBDP: 10.0000 mg | ORAL_TABLET | ORAL | 4 refills | Status: DC | PRN

## 2019-03-15 NOTE — Telephone Encounter (Signed)
May give 4 refills

## 2019-03-15 NOTE — Telephone Encounter (Signed)
Refills sent in and patient is aware.  

## 2019-03-15 NOTE — Telephone Encounter (Signed)
Pt would like a refill on rizatriptan (MAXALT-MLT) 10 MG to have on hand for migraines.  [place], [place]
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
## 2019-03-31 ENCOUNTER — Encounter (HOSPITAL_COMMUNITY): Payer: Self-pay | Admitting: Psychiatry

## 2019-03-31 ENCOUNTER — Ambulatory Visit (INDEPENDENT_AMBULATORY_CARE_PROVIDER_SITE_OTHER): Payer: BC Managed Care – PPO | Admitting: Psychiatry

## 2019-03-31 ENCOUNTER — Other Ambulatory Visit: Payer: Self-pay

## 2019-03-31 DIAGNOSIS — F410 Panic disorder [episodic paroxysmal anxiety] without agoraphobia: Secondary | ICD-10-CM | POA: Diagnosis not present

## 2019-03-31 DIAGNOSIS — F4010 Social phobia, unspecified: Secondary | ICD-10-CM

## 2019-03-31 NOTE — Patient Instructions (Signed)
1. Start lexapro 5 mg daily for one week, then 10 mg daily  2. Referral to therapy  3. Next appointment: 8/20 at 3:30

## 2019-04-11 ENCOUNTER — Other Ambulatory Visit (HOSPITAL_COMMUNITY): Payer: Self-pay | Admitting: Psychiatry

## 2019-04-11 ENCOUNTER — Telehealth (HOSPITAL_COMMUNITY): Payer: Self-pay | Admitting: *Deleted

## 2019-04-11 MED ORDER — ESCITALOPRAM OXALATE 10 MG PO TABS: ORAL_TABLET | ORAL | 0 refills | Status: DC

## 2019-04-11 NOTE — Telephone Encounter (Signed)
PATIENT CALLED STATED HER Rx FOR . Start lexapro 5 mg daily for one week, then 10 mg daily  HASN'T BEEN SENT TO HER Rx

## 2019-04-11 NOTE — Telephone Encounter (Signed)
Sorry I missed to finalize the order. Order sent to pharmacy.

## 2019-04-11 NOTE — Telephone Encounter (Signed)
LVM : Start lexapro 5 mg daily for one week, then 10 mg daily HAS BEEN SENT TO HER Rx
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
## 2019-04-28 ENCOUNTER — Ambulatory Visit (INDEPENDENT_AMBULATORY_CARE_PROVIDER_SITE_OTHER): Payer: BC Managed Care – PPO | Admitting: Psychiatry

## 2019-04-28 ENCOUNTER — Other Ambulatory Visit: Payer: Self-pay

## 2019-04-28 ENCOUNTER — Encounter (HOSPITAL_COMMUNITY): Payer: Self-pay | Admitting: Psychiatry

## 2019-04-28 DIAGNOSIS — F4010 Social phobia, unspecified: Secondary | ICD-10-CM

## 2019-04-28 DIAGNOSIS — G4700 Insomnia, unspecified: Secondary | ICD-10-CM

## 2019-04-28 DIAGNOSIS — F410 Panic disorder [episodic paroxysmal anxiety] without agoraphobia: Secondary | ICD-10-CM | POA: Diagnosis not present

## 2019-04-28 MED ORDER — ESCITALOPRAM OXALATE 10 MG PO TABS: 10.0000 mg | ORAL_TABLET | Freq: Every day | ORAL | 0 refills | Status: DC

## 2019-04-28 NOTE — Patient Instructions (Signed)
1. Continue lexapro 10 mg daily  2. Next appointment: 10/19 at 4:10

## 2019-05-04 ENCOUNTER — Other Ambulatory Visit: Payer: Self-pay

## 2019-05-04 ENCOUNTER — Ambulatory Visit (INDEPENDENT_AMBULATORY_CARE_PROVIDER_SITE_OTHER): Payer: BC Managed Care – PPO | Admitting: Psychiatry

## 2019-05-04 ENCOUNTER — Encounter (HOSPITAL_COMMUNITY): Payer: Self-pay | Admitting: Psychiatry

## 2019-05-04 DIAGNOSIS — F410 Panic disorder [episodic paroxysmal anxiety] without agoraphobia: Secondary | ICD-10-CM | POA: Diagnosis not present

## 2019-05-04 DIAGNOSIS — F4010 Social phobia, unspecified: Secondary | ICD-10-CM | POA: Diagnosis not present

## 2019-05-05 ENCOUNTER — Encounter (HOSPITAL_COMMUNITY): Payer: Self-pay | Admitting: Psychiatry
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
## 2019-06-08 ENCOUNTER — Other Ambulatory Visit: Payer: Self-pay

## 2019-06-08 ENCOUNTER — Encounter: Payer: Self-pay | Admitting: Family Medicine

## 2019-06-08 ENCOUNTER — Ambulatory Visit (INDEPENDENT_AMBULATORY_CARE_PROVIDER_SITE_OTHER): Payer: BC Managed Care – PPO | Admitting: Family Medicine

## 2019-06-08 DIAGNOSIS — N3000 Acute cystitis without hematuria: Secondary | ICD-10-CM | POA: Diagnosis not present

## 2019-06-08 MED ORDER — NITROFURANTOIN MONOHYD MACRO 100 MG PO CAPS: 100.0000 mg | ORAL_CAPSULE | Freq: Two times a day (BID) | ORAL | 0 refills | Status: DC

## 2019-06-08 NOTE — Progress Notes (Signed)
   Subjective:  Audio plus video  Patient ID: Sara Powers, female    DOB: 21-Feb-1966, 53 y.o.   MRN: [id number]  HPI  Patient calls with painful urination for a few days. Patient states it is not bad just uncomfortable and she has a history of UTS and wants to catch it before gets bad.  Virtual Visit via Video Note  I connected with ZAYNA TOSTE on 06/08/19 at 11:00 AM EDT by a video enabled telemedicine application and verified that I am speaking with the correct person using two identifiers.  Location: Patient: home Provider: office   I discussed the limitations of evaluation and management by telemedicine and the availability of in person appointments. The patient expressed understanding and agreed to proceed.  History of Present Illness:    Observations/Objective:   Assessment and Plan:   Follow Up Instructions:    I discussed the assessment and treatment plan with the patient. The patient was provided an opportunity to ask questions and all were answered. The patient agreed with the plan and demonstrated an understanding of the instructions.   The patient was advised to call back or seek an in-person evaluation if the symptoms worsen or if the condition fails to improve as anticipated.  I provided 18 minutes of non-face-to-face time during this encounter.   Patient states these are sometimes quite close to prior UTIs in the past.  Increased urinary frequency.  Positive dysuria.  Some nocturia  No fever no chills no achiness no systemic signs   Review of Systems     Objective:   Physical Exam   Virtual     Assessment & Plan:  Impression probable UTI.  Discussed.  Antibiotics prescribed symptom care discussed encouraged to get over-the-counter Azo-Standard warning signs discussed

## 2019-06-21 NOTE — Progress Notes (Signed)
Virtual Visit via Video Note  I connected with Sara Powers on 06/27/19 at  4:10 PM EDT by a video enabled telemedicine application and verified that I am speaking with the correct person using two identifiers.   I discussed the limitations of evaluation and management by telemedicine and the availability of in person appointments. The patient expressed understanding and agreed to proceed.     I discussed the assessment and treatment plan with the patient. The patient was provided an opportunity to ask questions and all were answered. The patient agreed with the plan and demonstrated an understanding of the instructions.   The patient was advised to call back or seek an in-person evaluation if the symptoms worsen or if the condition fails to improve as anticipated.  I provided 15 minutes of non-face-to-face time during this encounter.   Norman Clay, MD     [facility] MD/PA/NP OP Progress Note  06/27/2019 4:33 PM Sara Powers  MRN:  [id number]  Chief Complaint:  Chief Complaint    Anxiety; Follow-up     HPI:  This is a follow up appointment for anxiety.  She states that she has been feeling very overwhelmed.  She had an exposure to people with COVID last week, and she now needs to teach online. She feels frustrated that her children are not learning as they are supposed to. She also feels exhausted as she needs to do extra work for [employer] such as down loading and grading activity. She sleeps most of the time on Saturday, and doing thins related to school on Sunday. Although she did have a good time doing shopping with her sister the other day, she had to catch up things for school work. She feels done, and she is hoping to do part time next year. She has insomnia. She feels fatigue.  She reports feeling depressed ("shutting down) around her menstrual cycle.  She has difficulty with concentration.  She has mild anhedonia.  She denies SI.  She feels anxious, tense and  irritable.  She denies panic attacks.   Visit Diagnosis:    ICD-10-CM   1. Panic disorder  F41.0   2. Social phobia  F40.10     Past Psychiatric History: Please see initial evaluation for full details. I have reviewed the history. No updates at this time.     Past Medical History:  Past Medical History:  Diagnosis Date  . Asthma   . Insulin resistance   . Panic attacks 02/09/2019    Past Surgical History:  Procedure Laterality Date  . BREAST BIOPSY Left 12/2012   negative  . CHOLECYSTECTOMY      Family Psychiatric History: Please see initial evaluation for full details. I have reviewed the history. No updates at this time.     Family History:  Family History  Problem Relation Age of Onset  . Hypertension Father   . Heart disease Father   . Hyperlipidemia Father   . Alcohol abuse Father   . Diabetes Other   . Cancer Other        lung    Social History:  Social History   Socioeconomic History  . Marital status: Married    Spouse name: Not on file  . Number of children: Not on file  . Years of education: Not on file  . Highest education level: Not on file  Occupational History  . Not on file  Social Needs  . Financial resource strain: Not on file  . Food  insecurity    Worry: Not on file    Inability: Not on file  . Transportation needs    Medical: Not on file    Non-medical: Not on file  Tobacco Use  . Smoking status: Never Smoker  . Smokeless tobacco: Never Used  Substance and Sexual Activity  . Alcohol use: Never    Frequency: Never  . Drug use: Never  . Sexual activity: Not Currently  Lifestyle  . Physical activity    Days per week: Not on file    Minutes per session: Not on file  . Stress: Not on file  Relationships  . Social Musician on phone: Not on file    Gets together: Not on file    Attends religious service: Not on file    Active member of club or organization: Not on file    Attends meetings of clubs or organizations:  Not on file    Relationship status: Not on file  Other Topics Concern  . Not on file  Social History Narrative  . Not on file    Allergies:  Allergies  Allergen Reactions  . Phentermine Anxiety    agitation  . Augmentin [Amoxicillin-Pot Clavulanate] Hives  . Sulfa Antibiotics Hives  . Effexor [Venlafaxine] Other (See Comments)    Agitation and dizziness    Metabolic Disorder Labs: Lab Results  Component Value Date   HGBA1C 5.0 03/26/2017   No results found for: PROLACTIN Lab Results  Component Value Date   CHOL 188 10/24/2017   TRIG 149 10/24/2017   HDL 48 10/24/2017   CHOLHDL 3.9 10/24/2017   VLDL 26 06/06/2014   LDLCALC 110 (H) 10/24/2017   LDLCALC 106 (H) 06/06/2014   Lab Results  Component Value Date   TSH 1.830 10/24/2017   TSH 3.056 06/06/2014    Therapeutic Level Labs: No results found for: LITHIUM No results found for: VALPROATE No components found for:  CBMZ  Current Medications: Current Outpatient Medications  Medication Sig Dispense Refill  . albuterol (PROVENTIL HFA;VENTOLIN HFA) 108 (90 Base) MCG/ACT inhaler Inhale 2 puffs into the lungs every 4 (four) hours as needed for wheezing or shortness of breath. 1 Inhaler 2  . clonazePAM (KLONOPIN) 0.5 MG tablet Take 1/2 tablet bid prn anxiety. Caution drowsiness 30 tablet 2  . escitalopram (LEXAPRO) 20 MG tablet Take 1 tablet (20 mg total) by mouth daily. 90 tablet 1  . nitrofurantoin, macrocrystal-monohydrate, (MACROBID) 100 MG capsule Take 1 capsule (100 mg total) by mouth 2 (two) times daily. 14 capsule 0  . rizatriptan (MAXALT-MLT) 10 MG disintegrating tablet Take 1 tablet (10 mg total) by mouth as needed for migraine. May repeat in 2 hours if needed; max 2 per 24 hours 10 tablet 4  . sertraline (ZOLOFT) 100 MG tablet Take 1 tablet (100 mg total) by mouth daily. (Patient not taking: Reported on 04/28/2019) 30 tablet 3   No current facility-administered medications for this visit.       Musculoskeletal: Strength & Muscle Tone: N/A Gait & Station: N/A Patient leans: N/A  Psychiatric Specialty Exam: Review of Systems  Psychiatric/Behavioral: Positive for depression. Negative for hallucinations, memory loss, substance abuse and suicidal ideas. The patient is nervous/anxious and has insomnia.   All other systems reviewed and are negative.   There were no vitals taken for this visit.There is no height or weight on file to calculate BMI.  General Appearance: Fairly Groomed  Eye Contact:  Good  Speech:  Clear and Coherent  Volume:  Normal  Mood:  Anxious  Affect:  Appropriate, Congruent and fatigue  Thought Process:  Coherent  Orientation:  Full (Time, Place, and Person)  Thought Content: Logical   Suicidal Thoughts:  No  Homicidal Thoughts:  No  Memory:  Immediate;   Good  Judgement:  Good  Insight:  Fair  Psychomotor Activity:  Normal  Concentration:  Concentration: Good and Attention Span: Good  Recall:  Good  Fund of Knowledge: Good  Language: Good  Akathisia:  No  Handed:  Right  AIMS (if indicated): not done  Assets:  Communication Skills Desire for Improvement  ADL's:  Intact  Cognition: WNL  Sleep:  Poor   Screenings: GAD-7     Office Visit from [facility]/21/2017 in [facility] Office Visit [facility] 07[facility]/2017 in [facility]  Total GAD-7 Score  7  7    PHQ2-9     [facility] Visit [facility]2018 [facility]2017 in [facility]/2017 [facility]  PHQ-2 Total Score  4  3  1   PHQ-9 Total Score  10  12  -       Assessment and Plan:  Otis BraceStacey M Misch is a 53 y.o. 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  Other psychosocial stressors includes death of  her husband and her ex-husband.  She also does have significant family history of the child and in her previous marriage, which is likely attributing to anxiety/low self-esteem.  We will up titrate Lexapro to target anxiety.  She is encouraged to continue to see her therapist.  Coached self compassion.   Plan 1. Increase lexapro 20 mg daily  2. Next appointment: in January - on clonazepam 0.25 mg prn for anxiety, prescribed by PCP  Past trials of medication:sertraline (headache), lexapro, bupropion ("did not care anymore")  The patient demonstrates the following risk factors for suicide: Chronic risk factors for suicide include:psychiatric disorder ofanxiety. Acute risk factorsfor suicide include: N/A. Protective factorsfor this patient include: coping skills and hope for the future. Considering these factors, the overall suicide risk at this point appears to below. Patientisappropriate for outpatient follow up.   Neysa Hottereina Katharin Schneider, MD 06/27/2019, 4:33 PM

## 2019-06-22 ENCOUNTER — Ambulatory Visit (INDEPENDENT_AMBULATORY_CARE_PROVIDER_SITE_OTHER): Payer: BC Managed Care – PPO | Admitting: Psychiatry

## 2019-06-22 ENCOUNTER — Other Ambulatory Visit: Payer: Self-pay

## 2019-06-22 DIAGNOSIS — F410 Panic disorder [episodic paroxysmal anxiety] without agoraphobia: Secondary | ICD-10-CM
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
## 2019-06-27 ENCOUNTER — Encounter (HOSPITAL_COMMUNITY): Payer: Self-pay | Admitting: Psychiatry

## 2019-06-27 ENCOUNTER — Ambulatory Visit (INDEPENDENT_AMBULATORY_CARE_PROVIDER_SITE_OTHER): Payer: BC Managed Care – PPO | Admitting: Psychiatry

## 2019-06-27 ENCOUNTER — Other Ambulatory Visit: Payer: Self-pay

## 2019-06-27 DIAGNOSIS — F4010 Social phobia, unspecified: Secondary | ICD-10-CM | POA: Diagnosis not present

## 2019-06-27 DIAGNOSIS — F410 Panic disorder [episodic paroxysmal anxiety] without agoraphobia: Secondary | ICD-10-CM

## 2019-06-27 MED ORDER — ESCITALOPRAM OXALATE 20 MG PO TABS: 20.0000 mg | ORAL_TABLET | Freq: Every day | ORAL | 1 refills | Status: DC

## 2019-06-27 NOTE — Patient Instructions (Signed)
1. Increase lexapro 20 mg daily  2. Next appointment: in January

## 2019-09-12 ENCOUNTER — Ambulatory Visit (INDEPENDENT_AMBULATORY_CARE_PROVIDER_SITE_OTHER): Payer: BC Managed Care – PPO | Admitting: Family Medicine

## 2019-09-12 ENCOUNTER — Other Ambulatory Visit: Payer: Self-pay

## 2019-09-12 DIAGNOSIS — M25571 Pain in right ankle and joints of right foot: Secondary | ICD-10-CM

## 2019-09-12 DIAGNOSIS — M25572 Pain in left ankle and joints of left foot: Secondary | ICD-10-CM

## 2019-09-12 DIAGNOSIS — R5383 Other fatigue: Secondary | ICD-10-CM | POA: Diagnosis not present

## 2019-09-12 DIAGNOSIS — R7301 Impaired fasting glucose: Secondary | ICD-10-CM | POA: Diagnosis not present

## 2019-09-12 DIAGNOSIS — E559 Vitamin D deficiency, unspecified: Secondary | ICD-10-CM | POA: Diagnosis not present

## 2019-09-12 MED ORDER — DICLOFENAC SODIUM 75 MG PO TBEC: 75.0000 mg | DELAYED_RELEASE_TABLET | Freq: Two times a day (BID) | ORAL | 0 refills | Status: DC | PRN

## 2019-09-12 NOTE — Progress Notes (Signed)
   Subjective:    Patient ID: Sara Powers, female    DOB: 01-13-1966, 54 y.o.   MRN: [id number]  HPI  Patient relates a lot of fatigue discomfort joint pain denies muscle pains just feeling tired and achy Patient calls with fatigue and joint pain. Patient states she feels the same way she did in the past when her Vit D level was low.  Low energy.  She wonders what may be going on.  Denies being depressed currently. Virtual Visit via Video Note  I connected with Sara Powers on 09/12/19 at  3:00 PM EST by a video enabled telemedicine application and verified that I am speaking with the correct person using two identifiers.  Location: Patient: home Provider: office   I discussed the limitations of evaluation and management by telemedicine and the availability of in person appointments. The patient expressed understanding and agreed to proceed.  History of Present Illness:    Observations/Objective:   Assessment and Plan:   Follow Up Instructions:    I discussed the assessment and treatment plan with the patient. The patient was provided an opportunity to ask questions and all were answered. The patient agreed with the plan and demonstrated an understanding of the instructions.   The patient was advised to call back or seek an in-person evaluation if the symptoms worsen or if the condition fails to improve as anticipated.  I provided 16 minutes of non-face-to-face time during this encounter.     Review of Systems  Constitutional: Positive for fatigue. Negative for activity change and appetite change.  HENT: Negative for congestion and rhinorrhea.   Respiratory: Negative for cough and shortness of breath.   Cardiovascular: Negative for chest pain and leg swelling.  Gastrointestinal: Negative for abdominal pain and diarrhea.  Endocrine: Negative for polydipsia and polyphagia.  Skin: Negative for color change.  Neurological: Negative for dizziness and weakness.   Psychiatric/Behavioral: Negative for behavioral problems and confusion.       Objective:   Physical Exam Patient had virtual visit Appears to be in no distress Atraumatic Neuro able to relate and oriented No apparent resp distress Color normal        Assessment & Plan:  1. Vitamin D deficiency We will check vitamin D level - Vitamin D 25 hydroxy  2. Other fatigue I believe the fatigue is probably coming from a combination of symptoms we will check lab work. - TSH - T4, free - CBC with Diff - Ferritin  3. Fasting hyperglycemia Because of fasting hyperglycemia check A1c as well - Hemoglobin A1c - Comprehensive metabolic panel  4. Pain in joints of both feet With her foot pain I recommend anti-inflammatory twice daily over the course the next 2 weeks if any ongoing troubles to notify us not to use anti-inflammatories long-term because of increased risk of ulcers patient aware - Sed Rate (ESR) - C-reactive protein

## 2019-09-15 LAB — CBC WITH DIFFERENTIAL/PLATELET
Basophils Absolute: 0.1 10*3/uL (ref 0.0–0.2)
Basos: 1 %
EOS (ABSOLUTE): 0.2 10*3/uL (ref 0.0–0.4)
Eos: 2 %
Hematocrit: 40.6 % (ref 34.0–46.6)
Hemoglobin: 13.5 g/dL (ref 11.1–15.9)
Immature Grans (Abs): 0 10*3/uL (ref 0.0–0.1)
Immature Granulocytes: 0 %
Lymphocytes Absolute: 3.1 10*3/uL (ref 0.7–3.1)
Lymphs: 40 %
MCH: 29.3 pg (ref 26.6–33.0)
MCHC: 33.3 g/dL (ref 31.5–35.7)
MCV: 88 fL (ref 79–97)
Monocytes Absolute: 0.6 10*3/uL (ref 0.1–0.9)
Monocytes: 8 %
Neutrophils Absolute: 3.8 10*3/uL (ref 1.4–7.0)
Neutrophils: 49 %
Platelets: 335 10*3/uL (ref 150–450)
RBC: 4.61 x10E6/uL (ref 3.77–5.28)
RDW: 12.8 % (ref 11.7–15.4)
WBC: 7.8 10*3/uL (ref 3.4–10.8)

## 2019-09-15 LAB — COMPREHENSIVE METABOLIC PANEL
ALT: 18 IU/L (ref 0–32)
AST: 18 IU/L (ref 0–40)
Albumin/Globulin Ratio: 1.4 (ref 1.2–2.2)
Albumin: 4 g/dL (ref 3.8–4.9)
Alkaline Phosphatase: 84 IU/L (ref 39–117)
BUN/Creatinine Ratio: 10 (ref 9–23)
BUN: 8 mg/dL (ref 6–24)
Bilirubin Total: 0.3 mg/dL (ref 0.0–1.2)
CO2: 25 mmol/L (ref 20–29)
Calcium: 9 mg/dL (ref 8.7–10.2)
Chloride: 104 mmol/L (ref 96–106)
Creatinine, Ser: 0.8 mg/dL (ref 0.57–1.00)
GFR calc Af Amer: 99 mL/min/{1.73_m2} (ref >59)
GFR calc non Af Amer: 86 mL/min/{1.73_m2} (ref >59)
Globulin, Total: 2.9 g/dL (ref 1.5–4.5)
Glucose: 105 mg/dL — ABNORMAL HIGH (ref 65–99)
Potassium: 4.3 mmol/L (ref 3.5–5.2)
Sodium: 139 mmol/L (ref 134–144)
Total Protein: 6.9 g/dL (ref 6.0–8.5)

## 2019-09-15 LAB — C-REACTIVE PROTEIN: CRP: 2 mg/L (ref 0–10)

## 2019-09-15 LAB — FERRITIN: Ferritin: 41 ng/mL (ref 15–150)

## 2019-09-15 LAB — TSH: TSH: 2.51 u[IU]/mL (ref 0.450–4.500)

## 2019-09-15 LAB — HEMOGLOBIN A1C
Est. average glucose Bld gHb Est-mCnc: 114 mg/dL
Hgb A1c MFr Bld: 5.6 % (ref 4.8–5.6)

## 2019-09-15 LAB — T4, FREE: Free T4: 1.01 ng/dL (ref 0.82–1.77)

## 2019-09-15 LAB — SEDIMENTATION RATE: Sed Rate: 9 mm/hr (ref 0–40)

## 2019-09-15 LAB — VITAMIN D 25 HYDROXY (VIT D DEFICIENCY, FRACTURES): Vit D, 25-Hydroxy: 31.6 ng/mL (ref 30.0–100.0)

## 2019-09-26 ENCOUNTER — Ambulatory Visit (HOSPITAL_COMMUNITY): Payer: BC Managed Care – PPO | Admitting: Psychiatry

## 2019-10-13 ENCOUNTER — Ambulatory Visit: Payer: BC Managed Care – PPO | Admitting: Psychiatry

## 2019-11-06 ENCOUNTER — Ambulatory Visit: Payer: BC Managed Care – PPO | Attending: Internal Medicine

## 2019-11-06 DIAGNOSIS — Z23 Encounter for immunization: Secondary | ICD-10-CM | POA: Insufficient documentation

## 2019-11-06 NOTE — Progress Notes (Signed)
   Covid-19 Vaccination Clinic  Name:  SALEENA TAMAS    MRN: [id number] DOB: Jan 15, 1966  11/06/2019  Ms. Behrens was observed post Covid-19 immunization for 15 minutes without incidence. She was provided with Vaccine Information Sheet and instruction to access the V-Safe system.   Ms. Ontko was instructed to call 911 with any severe reactions post vaccine: Marland Kitchen Difficulty breathing  . Swelling of your face and throat  . A fast heartbeat  . A bad rash all over your body  . Dizziness and weakness    Immunizations Administered    Name Date Dose VIS Date Route   Moderna COVID-19 Vaccine 11/06/2019  3:41 PM 0.5 mL 08/09/2019 Intramuscular   Manufacturer: Moderna   Lot: [id number]   NDC: 11657-903-83

## 2019-11-16 ENCOUNTER — Other Ambulatory Visit: Payer: Self-pay

## 2019-11-16 ENCOUNTER — Ambulatory Visit (INDEPENDENT_AMBULATORY_CARE_PROVIDER_SITE_OTHER): Payer: BC Managed Care – PPO | Admitting: Family Medicine

## 2019-11-16 DIAGNOSIS — R0981 Nasal congestion: Secondary | ICD-10-CM

## 2019-11-16 DIAGNOSIS — J310 Chronic rhinitis: Secondary | ICD-10-CM

## 2019-11-16 DIAGNOSIS — J329 Chronic sinusitis, unspecified: Secondary | ICD-10-CM

## 2019-11-16 MED ORDER — DOXYCYCLINE HYCLATE 100 MG PO TABS: 100.0000 mg | ORAL_TABLET | Freq: Two times a day (BID) | ORAL | 0 refills | Status: DC

## 2019-11-16 NOTE — Progress Notes (Signed)
   Subjective:  Audio only  Patient ID: Sara Powers, female    DOB: 10/18/1965, 54 y.o.   MRN: [id number]  Sinusitis This is a new problem. Episode onset: 5 days. (Dizziness, headache, )   Had covid vaccine on the 28th.   Felt a little dizzy and mild headache  Headache got worse thru the weeekned  Pt has noted a little bit of drainage  No fever.  No sore throat.  No shortness of breath.  No systemic achiness or joint aches.  Requesting a refill on maxalt.   This h a feels a little different than migr  migr pil  Teacher, back to school fulltime   Virtual Visit via Telephone Note  I connected with Sara Powers on 11/16/19 at  1:10 PM EST by telephone and verified that I am speaking with the correct person using two identifiers.  Location: Patient: home Provider: office   I discussed the limitations, risks, security and privacy concerns of performing an evaluation and management service by telephone and the availability of in person appointments. I also discussed with the patient that there may be a patient responsible charge related to this service. The patient expressed understanding and agreed to proceed.   History of Present Illness:    Observations/Objective:   Assessment and Plan:   Follow Up Instructions:    I discussed the assessment and treatment plan with the patient. The patient was provided an opportunity to ask questions and all were answered. The patient agreed with the plan and demonstrated an understanding of the instructions.   The patient was advised to call back or seek an in-person evaluation if the symptoms worsen or if the condition fails to improve as anticipated.  I provided 20 minutes of non-face-to-face time during this encounter.      Review of Systems No vomiting no diarrhea no rash    Objective:   Physical Exam  Virtual      Assessment & Plan:  Impression patient states this feels like sinus infection in the  past.  Some slight nasal congestion.  Patient highly doubt the presence of COVID-19.  Has had a COVID-19 vaccine #1.  Realizes she still can develop COVID-19.  Warning signs discussed.  If symptoms progress or if others around her become sick will need to reconsider certainly.  Will cover with antibiotics.

## 2019-12-10 ENCOUNTER — Ambulatory Visit: Payer: BC Managed Care – PPO | Attending: Internal Medicine

## 2019-12-10 DIAGNOSIS — Z23 Encounter for immunization: Secondary | ICD-10-CM

## 2019-12-10 NOTE — Progress Notes (Signed)
   Covid-19 Vaccination Clinic  Name:  Sara Powers    MRN: [id number] DOB: [birth year]  12/10/2019  Sara Powers was observed post Covid-19 immunization for 15 minutes without incident. She was provided with Vaccine Information Sheet and instruction to access the V-Safe system.   Sara Powers was instructed to call 911 with any severe reactions post vaccine: Marland Kitchen Difficulty breathing  . Swelling of face and throat  . A fast heartbeat  . A bad rash all over body  . Dizziness and weakness   Immunizations Administered    Name Date Dose VIS Date Route   Moderna COVID-19 Vaccine 12/10/2019 12:43 PM 0.5 mL 08/09/2019 Intramuscular   Manufacturer: Moderna   Lot: [id number]   NDC: 83818-403-75

## 2020-03-06 ENCOUNTER — Ambulatory Visit: Payer: BC Managed Care – PPO | Admitting: Family Medicine

## 2020-03-06 ENCOUNTER — Other Ambulatory Visit: Payer: Self-pay

## 2020-03-06 ENCOUNTER — Other Ambulatory Visit: Payer: Self-pay | Admitting: Physician Assistant

## 2020-03-06 ENCOUNTER — Encounter: Payer: Self-pay | Admitting: Family Medicine

## 2020-03-06 ENCOUNTER — Other Ambulatory Visit: Payer: Self-pay | Admitting: Family Medicine

## 2020-03-06 VITALS — BP 130/86 | Temp 97.9°F | Ht 66.0 in | Wt 217.8 lb

## 2020-03-06 DIAGNOSIS — Z1231 Encounter for screening mammogram for malignant neoplasm of breast: Secondary | ICD-10-CM

## 2020-03-06 DIAGNOSIS — R739 Hyperglycemia, unspecified: Secondary | ICD-10-CM | POA: Diagnosis not present

## 2020-03-06 DIAGNOSIS — E8881 Metabolic syndrome: Secondary | ICD-10-CM

## 2020-03-06 LAB — POCT GLYCOSYLATED HEMOGLOBIN (HGB A1C): Hemoglobin A1C: 5.1 % (ref 4.0–5.6)

## 2020-03-06 MED ORDER — RIZATRIPTAN BENZOATE 10 MG PO TBDP: 10.0000 mg | ORAL_TABLET | ORAL | 4 refills | Status: DC | PRN

## 2020-03-06 MED ORDER — METFORMIN HCL 500 MG PO TABS
500.0000 mg | ORAL_TABLET | Freq: Every day | ORAL | 1 refills | Status: DC
Start: 2020-03-06 — End: 2020-11-01

## 2020-03-06 MED ORDER — ALBUTEROL SULFATE HFA 108 (90 BASE) MCG/ACT IN AERS: 2.0000 | INHALATION_SPRAY | RESPIRATORY_TRACT | 2 refills | Status: DC | PRN

## 2020-03-06 MED ORDER — CLONAZEPAM 0.5 MG PO TABS: ORAL_TABLET | ORAL | 1 refills | Status: DC

## 2020-03-06 NOTE — Progress Notes (Signed)
   Subjective:    Patient ID: Sara Powers, female    DOB: 05/08/1966, 54 y.o.   MRN: [id number]  HPIfollow up on blood sugar. Pt checks at home and states it has been up even though she has cut down sugar and carbs.  Patient states her sugars been around the 120 mark at times other x110 Needs refill on albuterol inhaler.   Results for orders placed or performed in visit on 03/06/20  POCT glycosylated hemoglobin (Hb A1C)  Result Value Ref Range   Hemoglobin A1C 5.1 4.0 - 5.6 %   HbA1c POC (<> result, manual entry)     HbA1c, POC (prediabetic range)     HbA1c, POC (controlled diabetic range)        Review of Systems  Constitutional: Negative for activity change and appetite change.  HENT: Negative for congestion and rhinorrhea.   Respiratory: Negative for cough and shortness of breath.   Cardiovascular: Negative for chest pain and leg swelling.  Gastrointestinal: Negative for abdominal pain, nausea and vomiting.  Skin: Negative for color change.  Neurological: Negative for dizziness and weakness.  Psychiatric/Behavioral: Negative for agitation and confusion.       Objective:   Physical Exam Vitals reviewed.  Constitutional:      General: She is not in acute distress. HENT:     Head: Normocephalic.  Cardiovascular:     Rate and Rhythm: Normal rate and regular rhythm.     Heart sounds: Normal heart sounds. No murmur heard.   Pulmonary:     Effort: Pulmonary effort is normal.     Breath sounds: Normal breath sounds.  Lymphadenopathy:     Cervical: No cervical adenopathy.  Neurological:     Mental Status: She is alert.  Psychiatric:        Behavior: Behavior normal.      Consider Wegovy if ongoing troubles with weight loss     Assessment & Plan:  We talked at length about the importance of healthy eating minimizing starches regular physical activity.  Continue current measures.  Recommend that the patient follow-up by late fall.  Recheck A1c at that  time.  Significant weight gain insulin resistance history along with hyperglycemia Metformin 500 mg 1 daily if any low sugar spells to notify us may need to reduce it to 250 mg.  Follow diet stay active

## 2020-03-13 ENCOUNTER — Other Ambulatory Visit: Payer: Self-pay

## 2020-03-13 ENCOUNTER — Ambulatory Visit
Admission: RE | Admit: 2020-03-13 | Discharge: 2020-03-13 | Disposition: A | Discharge status: Home / Self Care | Payer: BC Managed Care – PPO | Source: Ambulatory Visit | Attending: Family Medicine | Admitting: Family Medicine

## 2020-03-13 DIAGNOSIS — Z1231 Encounter for screening mammogram for malignant neoplasm of breast: Secondary | ICD-10-CM

## 2020-05-28 LAB — HM PAP SMEAR: HM Pap smear: NORMAL

## 2020-07-10 ENCOUNTER — Ambulatory Visit: Payer: BC Managed Care – PPO | Admitting: Physician Assistant

## 2020-09-09 ENCOUNTER — Ambulatory Visit (INDEPENDENT_AMBULATORY_CARE_PROVIDER_SITE_OTHER): Payer: Self-pay

## 2020-09-09 ENCOUNTER — Other Ambulatory Visit: Payer: Self-pay

## 2020-09-09 ENCOUNTER — Ambulatory Visit
Admission: RE | Admit: 2020-09-09 | Discharge: 2020-09-09 | Disposition: A | Discharge status: Home / Self Care | Payer: Self-pay | Source: Ambulatory Visit | Attending: Family Medicine | Admitting: Family Medicine

## 2020-09-09 VITALS — BP 139/86 | HR 80 | Temp 98.2°F | Resp 15 | Ht 66.0 in | Wt 220.0 lb

## 2020-09-09 DIAGNOSIS — S60011A Contusion of right thumb without damage to nail, initial encounter: Secondary | ICD-10-CM

## 2020-09-09 DIAGNOSIS — M79644 Pain in right finger(s): Secondary | ICD-10-CM

## 2020-09-09 HISTORY — DX: Migraine, unspecified, not intractable, without status migrainosus: G43.909

## 2020-09-09 NOTE — Discharge Instructions (Signed)
Try Aleve 2 pills morning and night with food Protection from trauma Return as needed

## 2020-09-09 NOTE — ED Triage Notes (Signed)
Patient states that her shower head fell and hit her rt thumb x 1.5 weeks ago.

## 2020-09-09 NOTE — ED Provider Notes (Signed)
[facility]    CSN: [id number] Arrival date & time: 09/09/20  1157      History   Chief Complaint No chief complaint on file.   HPI Sara Powers is a 55 y.o. female.   HPI   Showerhead fell off the shower and hit her in the thumb.  This happened a week and a half ago.  Still very tender.  She is here to get an x-ray.  She has full use.  Full movement.  Full grip strength.  No pain with movement  Past Medical History:  Diagnosis Date  . Asthma   . Insulin resistance   . Migraine   . Panic attacks 02/09/2019    Patient Active Problem List   Diagnosis Date Noted  . Panic attacks 02/09/2019  . Vitamin D deficiency 10/26/2017  . Migraine 07/14/2017  . Rosacea 03/27/2017  . Impaired fasting glucose 06/14/2014  . Menorrhagia with regular cycle 06/05/2014  . Anxiety and depression 09/08/2013  . Abnormal mammogram 11/30/2012  . Insulin resistance   . Asthma     Past Surgical History:  Procedure Laterality Date  . BREAST BIOPSY Left 12/2012   negative  . CHOLECYSTECTOMY      OB History   No obstetric history on file.      Home Medications    Prior to Admission medications   Medication Sig Start Date End Date Taking? Authorizing Provider  albuterol (VENTOLIN HFA) 108 (90 Base) MCG/ACT inhaler Inhale 2 puffs into the lungs every 4 (four) hours as needed for wheezing or shortness of breath. 03/06/20  Yes Luking, Jonna Coup, MD  clonazePAM (KLONOPIN) 0.5 MG tablet Take 1/2 tablet bid prn anxiety. Caution drowsiness 03/06/20  Yes Luking, Jonna Coup, MD  rizatriptan (MAXALT-MLT) 10 MG disintegrating tablet Take 1 tablet (10 mg total) by mouth as needed for migraine. May repeat in 2 hours if needed; max 2 per 24 hours 03/06/20  Yes Luking, Jonna Coup, MD  diclofenac (VOLTAREN) 75 MG EC tablet Take 1 tablet (75 mg total) by mouth 2 (two) times daily as needed (arthragias). 09/12/19   Babs Sciara, MD  metFORMIN (GLUCOPHAGE) 500 MG tablet Take 1 tablet (500 mg total)  by mouth daily with breakfast. 03/06/20   Babs Sciara, MD    Family History Family History  Problem Relation Age of Onset  . Hypertension Father   . Heart disease Father   . Hyperlipidemia Father   . Alcohol abuse Father   . Diabetes Other   . Cancer Other        lung    Social History Social History   Tobacco Use  . Smoking status: Never Smoker  . Smokeless tobacco: Never Used  Substance Use Topics  . Alcohol use: Never  . Drug use: Never     Allergies   Phentermine, Augmentin [amoxicillin-pot clavulanate], Sulfa antibiotics, and Effexor [venlafaxine]   Review of Systems Review of Systems See HPI  Physical Exam Triage Vital Signs ED Triage Vitals  Enc Vitals Group     BP 09/09/20 1225 139/86     Pulse Rate 09/09/20 1225 80     Resp 09/09/20 1225 15     Temp 09/09/20 1225 98.2 F (36.8 C)     Temp src --      SpO2 09/09/20 1225 98 %     Weight 09/09/20 1248 220 lb (99.8 kg)     Height 09/09/20 1248 5\' 6"  (1.676 m)     Head  Circumference --      Peak Flow --      Pain Score 09/09/20 1248 5     Pain Loc --      Pain Edu? --      Excl. in GC? --    No data found.  Updated Vital Signs BP 139/86   Pulse 80   Temp 98.2 F (36.8 C)   Resp 15   Ht 5\' 6"  (1.676 m)   Wt 99.8 kg   LMP 08/25/2020   SpO2 98%   BMI 35.51 kg/m     Physical Exam Constitutional:      General: She is not in acute distress.    Appearance: She is well-developed and well-nourished.  HENT:     Head: Normocephalic and atraumatic.     Mouth/Throat:     Mouth: Oropharynx is clear and moist.     Comments: Mask is in place Eyes:     Conjunctiva/sclera: Conjunctivae normal.     Pupils: Pupils are equal, round, and reactive to light.  Cardiovascular:     Rate and Rhythm: Normal rate.  Pulmonary:     Effort: Pulmonary effort is normal. No respiratory distress.  Abdominal:     General: There is no distension.     Palpations: Abdomen is soft.  Musculoskeletal:         General: No edema. Normal range of motion.     Cervical back: Normal range of motion.     Comments: There is one tender place on the MCP joint.  Full range of motion.  No instability of the thumb towards the radial aspect of the joint  Skin:    General: Skin is warm and dry.  Neurological:     General: No focal deficit present.     Mental Status: She is alert.     Sensory: No sensory deficit.  Psychiatric:        Behavior: Behavior normal.      UC Treatments / Results  Labs (all labs ordered are listed, but only abnormal results are displayed) Labs Reviewed - No data to display  EKG   Radiology DG Finger Thumb Right  Result Date: 09/09/2020 CLINICAL DATA:  Injure right thumb 1.5 weeks ago with pain. EXAM: RIGHT THUMB 2+V COMPARISON:  None. FINDINGS: There is no evidence of fracture or dislocation. There is no evidence of arthropathy or other focal bone abnormality. Soft tissues are unremarkable. IMPRESSION: Negative. Electronically Signed   By: 11/07/2020 M.D.   On: 09/09/2020 12:48    Procedures Procedures (including critical care time)  Medications Ordered in UC Medications - No data to display  Initial Impression / Assessment and Plan / UC Course  I have reviewed the triage vital signs and the nursing notes.  Pertinent labs & imaging results that were available during my care of the patient were reviewed by me and considered in my medical decision making (see chart for details).     Contusion discussed.  Conservative care approach is appropriate Final Clinical Impressions(s) / UC Diagnoses   Final diagnoses:  Contusion of right thumb without damage to nail, initial encounter     Discharge Instructions     Try Aleve 2 pills morning and night with food Protection from trauma Return as needed   ED Prescriptions    None     PDMP not reviewed this encounter.   11/07/2020, MD 09/09/20 [phone]

## 2020-11-01 ENCOUNTER — Ambulatory Visit (INDEPENDENT_AMBULATORY_CARE_PROVIDER_SITE_OTHER): Payer: BC Managed Care – PPO | Admitting: Family Medicine

## 2020-11-01 ENCOUNTER — Encounter: Payer: Self-pay | Admitting: Family Medicine

## 2020-11-01 ENCOUNTER — Other Ambulatory Visit: Payer: Self-pay

## 2020-11-01 VITALS — BP 118/76 | Temp 97.4°F | Wt 220.6 lb

## 2020-11-01 DIAGNOSIS — E785 Hyperlipidemia, unspecified: Secondary | ICD-10-CM | POA: Diagnosis not present

## 2020-11-01 DIAGNOSIS — G43009 Migraine without aura, not intractable, without status migrainosus: Secondary | ICD-10-CM

## 2020-11-01 DIAGNOSIS — F411 Generalized anxiety disorder: Secondary | ICD-10-CM

## 2020-11-01 DIAGNOSIS — Z1211 Encounter for screening for malignant neoplasm of colon: Secondary | ICD-10-CM

## 2020-11-01 MED ORDER — CLONAZEPAM 0.5 MG PO TABS: ORAL_TABLET | ORAL | 1 refills | Status: DC

## 2020-11-01 MED ORDER — RIZATRIPTAN BENZOATE 10 MG PO TBDP: 10.0000 mg | ORAL_TABLET | ORAL | 4 refills | Status: DC | PRN

## 2020-11-01 NOTE — Progress Notes (Signed)
   Subjective:    Patient ID: Sara Powers, female    DOB: [birth year], 55 y.o.   MRN: [id number]  HPI Pt here for medication check. Pt is needing refill on Klonopin and Maxalt.  Very nice patient She finds himself feeling stressed and sometimes down but she denies being depressed she is not suicidal she states a lot of this stems from just being very stressed out from work she is retiring this summer and she is looking forward to it greatly  Review of Systems  Constitutional: Negative for activity change, appetite change and fatigue.  HENT: Negative for congestion.   Respiratory: Negative for cough.   Cardiovascular: Negative for chest pain.  Gastrointestinal: Negative for abdominal pain.  Skin: Negative for color change.  Neurological: Negative for headaches.  Psychiatric/Behavioral: Negative for behavioral problems.       Objective:   Physical Exam Vitals reviewed.  Constitutional:      General: She is not in acute distress.    Appearance: She is well-nourished.  HENT:     Head: Normocephalic.  Cardiovascular:     Rate and Rhythm: Normal rate and regular rhythm.     Heart sounds: Normal heart sounds. No murmur heard.   Pulmonary:     Effort: Pulmonary effort is normal.     Breath sounds: Normal breath sounds.  Musculoskeletal:        General: No edema.  Lymphadenopathy:     Cervical: No cervical adenopathy.  Neurological:     Mental Status: She is alert.  Psychiatric:        Behavior: Behavior normal.           Assessment & Plan:  1. Hyperlipidemia, unspecified hyperlipidemia type She will check lab work as requested watch diet stay active - Lipid Profile - Glucose  2. GAD (generalized anxiety disorder) May use medication on a as needed basis avoid frequent use follow-up if progressive troubles recheck within 6 months sooner if any issues - Lipid Profile - Glucose  3. Migraine without aura and without status migrainosus, not intractable Migraines  under good control they occur rarely refills given per request  4. Encounter for screening colonoscopy Colonoscopy is advisable for the patient.  We will go forward with it.  Patient states she be able to do it by early summer - Ambulatory referral to Gastroenterology

## 2020-11-03 LAB — LIPID PANEL
Chol/HDL Ratio: 5 ratio — ABNORMAL HIGH (ref 0.0–4.4)
Cholesterol, Total: 218 mg/dL — ABNORMAL HIGH (ref 100–199)
HDL: 44 mg/dL (ref >39)
LDL Chol Calc (NIH): 140 mg/dL — ABNORMAL HIGH (ref 0–99)
Triglycerides: 189 mg/dL — ABNORMAL HIGH (ref 0–149)
VLDL Cholesterol Cal: 34 mg/dL (ref 5–40)

## 2020-11-03 LAB — GLUCOSE, RANDOM: Glucose: 112 mg/dL — ABNORMAL HIGH (ref 65–99)

## 2020-11-06 ENCOUNTER — Encounter: Payer: Self-pay | Admitting: Internal Medicine

## 2020-12-12 ENCOUNTER — Ambulatory Visit (INDEPENDENT_AMBULATORY_CARE_PROVIDER_SITE_OTHER): Payer: BC Managed Care – PPO | Admitting: Nurse Practitioner

## 2020-12-12 ENCOUNTER — Encounter: Payer: Self-pay | Admitting: *Deleted

## 2020-12-12 ENCOUNTER — Other Ambulatory Visit: Payer: Self-pay

## 2020-12-12 ENCOUNTER — Ambulatory Visit: Payer: Self-pay | Admitting: Nurse Practitioner

## 2020-12-12 ENCOUNTER — Encounter: Payer: Self-pay | Admitting: Nurse Practitioner

## 2020-12-12 DIAGNOSIS — R197 Diarrhea, unspecified: Secondary | ICD-10-CM | POA: Insufficient documentation

## 2020-12-12 DIAGNOSIS — R109 Unspecified abdominal pain: Secondary | ICD-10-CM | POA: Insufficient documentation

## 2020-12-12 DIAGNOSIS — K921 Melena: Secondary | ICD-10-CM | POA: Insufficient documentation

## 2020-12-12 DIAGNOSIS — R1030 Lower abdominal pain, unspecified: Secondary | ICD-10-CM

## 2020-12-12 MED ORDER — NA SULFATE-K SULFATE-MG SULF 17.5-3.13-1.6 GM/177ML PO SOLN: 1.0000 | Freq: Once | ORAL | 0 refills | Status: AC

## 2020-12-12 MED ORDER — CHOLESTYRAMINE 4 G PO PACK
4.0000 g | PACK | Freq: Three times a day (TID) | ORAL | 12 refills | Status: DC
Start: 2020-12-12 — End: 2020-12-18

## 2020-12-12 NOTE — Progress Notes (Signed)
Primary Care Physician:  Kathyrn Drown, MD Primary Gastroenterologist:  Dr. Gala Romney  Chief Complaint  Patient presents with  . Colonoscopy    Never had tcs. GYN sent her to surgery center for hemorrhoids but they were unsure if hemorrhoids or rectal prolapse  . Diarrhea    Doesn't have gallbladder. Has urgency. Lower abd pain when episodes occur. Usually 15-20 minutes after meal. Blood at times and stool looks "oily". She is insulin resistant    HPI:   Sara Powers is a 55 y.o. female who presents to schedule a colonoscopy.  Nurse/contrast was deferred office visit due to medications likely necessitating augmented sedation.  Reviewed information associated with referral including primary care office visit dated 11/01/2020.  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  No mention of diarrhea or abdominal pain at that time.  No history of colonoscopy found in our system.  Today she states doing okay overall. She started having diarrhea when she had a gallbladder out about 22 years ago. Seems to be getting worse. Stools loose postprandially. Has to be cautious to what she eats when she's out in public. Affects her quality of life. When she has really severe bouts she will have bleeding. Cannot identify specific triggers. She does have abdominal discomfort with her loose stools. Stools look oily. She does have lactose sensitivity and generally avoids dairy. Typically has a stool only after eating (2-3 a day). She was sent to surgery for possible hemorrhoids and they felt they couldn't tell if it was hemorrhoids or prolapsed rectum and are wanting a colonoscopy. Denies N/V, melena, fever, chills, unintentional weight loss. Denies URI or flu-like symptoms. Denies loss of sense of taste or smell. The patient has received COVID-19 vaccination(s). They have had a booster as well. Denies chest pain, dyspnea, dizziness, lightheadedness, syncope, near syncope. Denies  any other upper or lower GI symptoms.  No known pancreas issues. She does have some increased stress as well. Imodium helps some, but can't always depend on it. When she does have abdominal pain if she has a bowel movement the pain improves.  Past Medical History:  Diagnosis Date  . Asthma   . Insulin resistance   . Migraine   . Panic attacks 02/09/2019    Past Surgical History:  Procedure Laterality Date  . BREAST BIOPSY Left 12/2012   negative  . CHOLECYSTECTOMY      Current Outpatient Medications  Medication Sig Dispense Refill  . albuterol (VENTOLIN HFA) 108 (90 Base) MCG/ACT inhaler Inhale 2 puffs into the lungs every 4 (four) hours as needed for wheezing or shortness of breath. 18 g 2  . clonazePAM (KLONOPIN) 0.5 MG tablet Take 1/2 tablet bid prn anxiety. Caution drowsiness 30 tablet 1  . diclofenac (VOLTAREN) 75 MG EC tablet Take 1 tablet (75 mg total) by mouth 2 (two) times daily as needed (arthragias). 28 tablet 0  . rizatriptan (MAXALT-MLT) 10 MG disintegrating tablet Take 1 tablet (10 mg total) by mouth as needed for migraine. May repeat in 2 hours if needed; max 2 per 24 hours 10 tablet 4   No current facility-administered medications for this visit.    Allergies as of 12/12/2020 - Review Complete 12/12/2020  Allergen Reaction Noted  . Phentermine Anxiety 12/28/2015  . Strawberry (diagnostic) Anaphylaxis 12/12/2020  . Augmentin [amoxicillin-pot clavulanate] Hives 12/07/2013  . Sulfa antibiotics Hives 11/29/2012  . Effexor [venlafaxine] Other (See Comments) 07/20/2014    Family History  Problem Relation  Age of Onset  . Hypertension Father   . Heart disease Father   . Hyperlipidemia Father   . Alcohol abuse Father   . Diabetes Other   . Cancer Other        lung  . Colon cancer Neg Hx     Social History   Socioeconomic History  . Marital status: Divorced    Spouse name: Not on file  . Number of children: Not on file  . Years of education: Not on file  .  Highest education level: Not on file  Occupational History  . Not on file  Tobacco Use  . Smoking status: Never Smoker  . Smokeless tobacco: Never Used  Substance and Sexual Activity  . Alcohol use: Yes    Comment: occ  . Drug use: Never  . Sexual activity: Not Currently  Other Topics Concern  . Not on file  Social History Narrative  . Not on file   Social Determinants of Health   Financial Resource Strain: Not on file  Food Insecurity: Not on file  Transportation Needs: Not on file  Physical Activity: Not on file  Stress: Not on file  Social Connections: Not on file  Intimate Partner Violence: Not on file    Subjective: Review of Systems  Constitutional: Negative for chills, fever, malaise/fatigue and weight loss.  HENT: Negative for congestion and sore throat.   Respiratory: Negative for cough and shortness of breath.   Cardiovascular: Negative for chest pain and palpitations.  Gastrointestinal: Positive for abdominal pain and diarrhea. Negative for blood in stool, heartburn, melena, nausea and vomiting.  Musculoskeletal: Negative for joint pain and myalgias.  Skin: Negative for rash.  Neurological: Negative for dizziness and weakness.  Endo/Heme/Allergies: Does not bruise/bleed easily.  Psychiatric/Behavioral: Negative for depression. The patient is not nervous/anxious.   All other systems reviewed and are negative.      Objective: BP (!) 149/89   Pulse 98   Temp (!) 97.3 F (36.3 C) (Temporal)   Ht _0  (1.651 m)   Wt 218 lb (98.9 kg)   LMP 11/20/2020 (Exact Date)   BMI 36.28 kg/m  Physical Exam Vitals and nursing note reviewed.  Constitutional:      General: She is not in acute distress.    Appearance: Normal appearance. She is well-developed. She is obese. She is not ill-appearing, toxic-appearing or diaphoretic.  HENT:     Head: Normocephalic and atraumatic.     Nose: No congestion or rhinorrhea.  Eyes:     General: No scleral  icterus. Cardiovascular:     Rate and Rhythm: Normal rate and regular rhythm.     Heart sounds: Normal heart sounds.  Pulmonary:     Effort: Pulmonary effort is normal. No respiratory distress.     Breath sounds: Normal breath sounds.  Abdominal:     General: Bowel sounds are normal.     Palpations: Abdomen is soft. There is no hepatomegaly, splenomegaly or mass.     Tenderness: There is no abdominal tenderness. There is no guarding or rebound.     Hernia: No hernia is present.  Skin:    General: Skin is warm and dry.     Coloration: Skin is not jaundiced.     Findings: No rash.  Neurological:     General: No focal deficit present.     Mental Status: She is alert and oriented to person, place, and time.  Psychiatric:        Attention and Perception:  Attention normal.        Mood and Affect: Mood normal.        Speech: Speech normal.        Behavior: Behavior normal.        Thought Content: Thought content normal.        Cognition and Memory: Cognition and memory normal.      Assessment:  Pleasant 55 year old female presents on referral from primary care to schedule colonoscopy.  She never had a colonoscopy before.  She does have occasional rectal bleeding when she has a significant stool.  No other red flag/warning signs or symptoms.  We will proceed with colonoscopy as per below  Chronic diarrhea, recently associated with abdominal discomfort: She has had chronic diarrhea ever since she had her gallbladder out about antiemetic she now states she also has occasional abdominal discomfort which is improved after bowel movement.  Denies nausea/vomiting, constipation.  Occasional blood in her stool as per above.  She has never been tried on any bile acid sequestrant.  She states her stools appear oily and "float".  Possible etiologies include chronic pancreatitis, especially in the setting of insulin resistance, also diarrhea, IBS, less likely inflammatory bowel disease or malignant  process.  There is also the possibility of multifactorial etiology including bouts of diarrhea with IBS overlay, for example, given her chronic diarrhea now associated with abdominal discomfort in the setting of increased stress.  I will check pancreatic fecal elastase.  I will also give her a course of Questran to see if this has any improvement.  I cautioned her about taking Questran to close other medications and she verbalized understanding.  We will also proceed with colonoscopy.  Further recommendations will follow.   Proceed with colonoscopy on propofol/MAC by Dr. Gala Romney in near future: the risks, benefits, and alternatives have been discussed with the patient in detail. The patient states understanding and desires to proceed.  Patient is currently on Klonopin. The patient is not on any other anticoagulants, anxiolytics, chronic pain medications, antidepressants, antidiabetics, or iron supplements.  We will plan for the procedure on propofol/MAC to promote adequate sedation.   Plan: 1. Pancreatic fecal elastase 2. Colonoscopy as described above 3. Questran 4 g 3 times daily with meals 4. Progress report in 2 weeks 5. Follow-up in 2 months    Thank you for allowing [place] to participate in the care of [place], DNP, AGNP-C Adult & Gerontological Nurse Practitioner [facility]   12/12/2020 3:58 PM   Disclaimer: This note was dictated with voice recognition software. Similar sounding words can inadvertently be transcribed and may not be corrected upon review.

## 2020-12-12 NOTE — Patient Instructions (Addendum)
Your health issues we discussed today were:   Need for colonoscopy/blood in your stool: 1. We will schedule colonoscopy for you 2. Please also help evaluate for other possible causes of diarrhea 3. Further recommendations will follow your colonoscopy  Chronic diarrhea with abdominal pain: 1. As we discussed, I feel there is likely more than 1 thing going on 2. 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 I sent a prescription for Questran 4 g.  Take this 3 times a day with meals 4. If you have other medications to take around the time of Questran, make sure you stay stent at least an hour before taking Questran or 4 hours after Questran 5. Call us in 2 weeks and let us know if it is helping you had better bowel movements 6. Have your stool test completed when you are able to 7. Further recommendations will follow depending on how you respond to the medications and the results of your colonoscopy  Overall I recommend:  1. Continue other current medications 2. Return for follow-up in 2 months 3. Call us for any questions or concerns   ---------------------------------------------------------------  I am glad you have gotten your COVID-19 vaccination!  Even though you are fully vaccinated you should continue to follow CDC and state/local guidelines.  ---------------------------------------------------------------   At [facility] we value your feedback. You may receive a survey about your visit today. Please share your experience as we strive to create trusting relationships with our patients to provide genuine, compassionate, quality care.  We appreciate your understanding and patience as we review any laboratory studies, imaging, and other diagnostic tests that are ordered as we care for you. Our office policy is 5 business days for review of these results, and any emergent or urgent results are addressed in a  timely manner for your best interest. If you do not hear from our office in 1 week, please contact us.   We also encourage the use of MyChart, which contains your medical information for your review as well. If you are not enrolled in this feature, an access code is on this after visit summary for your convenience. Thank you for allowing [place] to be involved in your care.  It was great to see you today!  I hope you have a great spring!!

## 2020-12-12 NOTE — Progress Notes (Signed)
Cc'ed to pcp °

## 2020-12-14 ENCOUNTER — Telehealth: Payer: Self-pay | Admitting: Internal Medicine

## 2020-12-14 DIAGNOSIS — R197 Diarrhea, unspecified: Secondary | ICD-10-CM

## 2020-12-14 DIAGNOSIS — K909 Intestinal malabsorption, unspecified: Secondary | ICD-10-CM

## 2020-12-14 NOTE — Telephone Encounter (Signed)
PATIENT NEEDS TO RESCHEDULE HER PROCEDURE DUE TO NO TRAONPORTATION

## 2020-12-14 NOTE — Telephone Encounter (Signed)
Spoke with pt. Questran powder contains an ingredient that causes her headaches. Pt is aware that Wynne Dust, NP is out of the office and our office is closing. Per pt it is no rush but she does want to inform him of this.

## 2020-12-14 NOTE — Telephone Encounter (Signed)
PLEASE CALL PATIENT, THE PRESCRIPTION WE SENT IN FOR HER CONTAINS SUCRALOSE AND THAT GIVERS HER MIGRAINES,.

## 2020-12-17 NOTE — Telephone Encounter (Signed)
LMOVM for pt 

## 2020-12-17 NOTE — Telephone Encounter (Signed)
Patient called. She needs to r/s procedure until July. I advised patient we did not have the schedule out that far and would call once we do.

## 2020-12-18 MED ORDER — COLESTIPOL HCL 1 G PO TABS: 4.0000 g | ORAL_TABLET | Freq: Three times a day (TID) | ORAL | 2 refills | Status: DC

## 2020-12-18 NOTE — Telephone Encounter (Signed)
Noted. Spoke with pt and she is aware that RX was sent in. Pt will contact our office if she has problems with medication.

## 2020-12-18 NOTE — Telephone Encounter (Signed)
Please call the patient and tell her I sent in a prescription for colestipol which is a pill form.  Hopefully this will not contain sucralose as it is not a packet of powder but a pill.  Let us know if any issues or problems.

## 2020-12-18 NOTE — Addendum Note (Signed)
Addended by: Delane Ginger, Obert Espindola A on: 12/18/2020 12:52 PM   Modules accepted: Orders

## 2020-12-25 NOTE — Telephone Encounter (Signed)
Received fax from pharmacy, Suprep not covered. Requested to change to Tri-lyte or Clenpiq. Pt to be rescheduled for procedure when July schedule is available.

## 2021-01-08 ENCOUNTER — Other Ambulatory Visit (HOSPITAL_COMMUNITY): Admission: RE | Admit: 2021-01-08 | Payer: BC Managed Care – PPO | Source: Ambulatory Visit

## 2021-01-10 ENCOUNTER — Encounter: Payer: Self-pay | Admitting: Gastroenterology

## 2021-01-10 ENCOUNTER — Encounter (HOSPITAL_COMMUNITY): Payer: Self-pay

## 2021-01-10 ENCOUNTER — Ambulatory Visit (HOSPITAL_COMMUNITY): Admit: 2021-01-10 | Payer: BC Managed Care – PPO | Admitting: Internal Medicine

## 2021-01-10 SURGERY — COLONOSCOPY WITH PROPOFOL
Anesthesia: Monitor Anesthesia Care

## 2021-02-13 NOTE — Telephone Encounter (Signed)
Letter mailed

## 2021-02-14 ENCOUNTER — Ambulatory Visit: Payer: BC Managed Care – PPO | Admitting: Gastroenterology

## 2021-02-18 ENCOUNTER — Ambulatory Visit: Payer: BC Managed Care – PPO | Admitting: Gastroenterology

## 2021-02-25 ENCOUNTER — Telehealth: Payer: Self-pay | Admitting: Internal Medicine

## 2021-02-25 MED ORDER — CLENPIQ 10-3.5-12 MG-GM -GM/160ML PO SOLN: 1.0000 | Freq: Once | ORAL | 0 refills | Status: AC

## 2021-02-25 NOTE — Telephone Encounter (Signed)
Called pt. She has been scheduled for TCS with propofol asa 2 with Dr. Jena Gauss on 8/29 at 7:30am. Aware will mail prep instructions and will send in rx to pharmacy. Confirmed address. Confirmed pharmacy.

## 2021-02-25 NOTE — Telephone Encounter (Signed)
Pt received  a letter that we were trying to reach her to schedule procedure. [phone]

## 2021-03-12 ENCOUNTER — Telehealth: Payer: Self-pay | Admitting: *Deleted

## 2021-03-12 ENCOUNTER — Other Ambulatory Visit: Payer: BC Managed Care – PPO

## 2021-03-12 ENCOUNTER — Other Ambulatory Visit: Payer: Self-pay

## 2021-03-12 NOTE — Telephone Encounter (Signed)
Pt came intoday for tb skin test. 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 Consult with dr Lorin Picket. Did not give tb test today and he ordered quantiferon tb gold plus blood test instead. Pt given bw orders to go over to [facility].

## 2021-03-15 ENCOUNTER — Encounter: Payer: Self-pay | Admitting: Family Medicine

## 2021-03-15 LAB — QUANTIFERON-TB GOLD PLUS
QuantiFERON Mitogen Value: >10 IU/mL
QuantiFERON Nil Value: 0.06 IU/mL
QuantiFERON TB1 Ag Value: 0.07 IU/mL
QuantiFERON TB2 Ag Value: 0.06 IU/mL
QuantiFERON-TB Gold Plus: NEGATIVE

## 2021-04-05 ENCOUNTER — Telehealth: Payer: BC Managed Care – PPO

## 2021-04-18 ENCOUNTER — Ambulatory Visit (INDEPENDENT_AMBULATORY_CARE_PROVIDER_SITE_OTHER): Payer: BC Managed Care – PPO | Admitting: Family Medicine

## 2021-04-18 ENCOUNTER — Other Ambulatory Visit: Payer: Self-pay

## 2021-04-18 VITALS — HR 84 | Temp 94.8°F | Wt 214.0 lb

## 2021-04-18 DIAGNOSIS — J019 Acute sinusitis, unspecified: Secondary | ICD-10-CM | POA: Diagnosis not present

## 2021-04-18 DIAGNOSIS — B9689 Other specified bacterial agents as the cause of diseases classified elsewhere: Secondary | ICD-10-CM | POA: Diagnosis not present

## 2021-04-18 MED ORDER — DOXYCYCLINE HYCLATE 100 MG PO TABS: 100.0000 mg | ORAL_TABLET | Freq: Two times a day (BID) | ORAL | 0 refills | Status: DC

## 2021-04-18 MED ORDER — CLONAZEPAM 0.5 MG PO TABS: ORAL_TABLET | ORAL | 1 refills | Status: DC

## 2021-04-18 NOTE — Progress Notes (Signed)
   Subjective:    Patient ID: Sara Powers, female    DOB: 24-May-1966, 55 y.o.   MRN: [id number]  HPI Pt had COVID 2 weeks ago and is now having sinus headache and left ear pain. Having slight sore throat but believes due to allergies. No trouble breathing or shortness of breath. At home COVID test negative yesterday.  Pt would like refill on Klonopin if possible.  Patient overall been doing well she was on a trip to [place] picked up COVID has recovered except for having sinus pressure pain and discomfort that is persistent greater than 10 days Review of Systems She denies being depressed    Objective:   Physical Exam  Moderate sinus tenderness left maxillary eardrums are normal lungs are clear no respiratory distress      Assessment & Plan:   I do not feel she has any residual COVID Doxycycline twice daily with a snack and water for 10 days Follow-up if ongoing troubles Anxiety related symptoms stable she requests refills of Klonopin this was given Follow-up if any ongoing issues

## 2021-05-02 ENCOUNTER — Telehealth: Payer: Self-pay

## 2021-05-02 NOTE — Telephone Encounter (Signed)
Patient called back and states she has decided she just wants to cancel procedure for now. Message sent to endo making aware.

## 2021-05-02 NOTE — Telephone Encounter (Signed)
Pt called office, told [facility] hospital called her and said TCS was listed as diagnostic instead of screening. Insurance wouldn't pay for TCS and cost was $1600.  Called pt, informed her TCS was for rectal bleeding (per OV note). Explained to pt TCS is diagnostic instead of screening. Advised pt she doesn't have to pay everything up front and can make payments. Pt wants to keep TCS as scheduled and will call back if she decides to cancel.

## 2021-05-06 ENCOUNTER — Ambulatory Visit (HOSPITAL_COMMUNITY)
Admission: RE | Admit: 2021-05-06 | Payer: BC Managed Care – PPO | Source: Ambulatory Visit | Admitting: Internal Medicine

## 2021-05-06 ENCOUNTER — Encounter (HOSPITAL_COMMUNITY): Admission: RE | Payer: Self-pay | Source: Ambulatory Visit

## 2021-05-06 SURGERY — COLONOSCOPY WITH PROPOFOL
Anesthesia: Monitor Anesthesia Care

## 2021-05-14 NOTE — Progress Notes (Deleted)
Referring Provider: Babs Sciara, MD Primary Care Physician:  Babs Sciara, MD Primary GI Physician: Dr. Bonnetta Barry chief complaint on file.   HPI:   Sara Powers is a 55 y.o. female presenting today for follow-up of diarrhea, hematochezia, lower abdominal pain.  Patient was last seen in our office at the time of initial consult for the same on 12/12/2020.  She reported chronic history of diarrhea since cholecystectomy about 22 years ago, but worsening.  Stools were loose, postprandial, typically 2-3/day.  Associated rectal bleeding with severe bouts.  Associated abdominal pain with loose stools that improves after bowel movement and also reported stools look oily.  Avoids dairy due to lactose sensitivity.  OB/GYN had previously referred her to general surgery due to hemorrhoids, but they felt they could not tell if it was hemorrhoids versus prolapse rectum and were waiting on colonoscopy.  Denies any other significant symptoms.  Differentials for diarrhea included pancreatic etiology in the setting of insulin resistance, IBS, less likely IBD or malignant process.  She was provided prescription for Questran and a colonoscopy arranged.  Patient called office back stating Questran contained sucralose which gives her migraines.  This was switched to colestipol.  Patient later canceled colonoscopy due to cost in the setting of diagnostic procedure.  Today:   Past Medical History:  Diagnosis Date   Asthma    Insulin resistance    Migraine    Panic attacks 02/09/2019    Past Surgical History:  Procedure Laterality Date   BREAST BIOPSY Left 12/2012   negative   CHOLECYSTECTOMY      Current Outpatient Medications  Medication Sig Dispense Refill   albuterol (VENTOLIN HFA) 108 (90 Base) MCG/ACT inhaler Inhale 2 puffs into the lungs every 4 (four) hours as needed for wheezing or shortness of breath. 18 g 2   clonazePAM (KLONOPIN) 0.5 MG tablet Take 1/2 tablet bid prn anxiety.  Caution drowsiness 30 tablet 1   colestipol (COLESTID) 1 g tablet Take 4 tablets (4 g total) by mouth with breakfast, with lunch, and with evening meal. 120 tablet 2   diclofenac (VOLTAREN) 75 MG EC tablet Take 1 tablet (75 mg total) by mouth 2 (two) times daily as needed (arthragias). (Patient not taking: No sig reported) 28 tablet 0   doxycycline (VIBRA-TABS) 100 MG tablet Take 1 tablet (100 mg total) by mouth 2 (two) times daily. (Patient not taking: No sig reported) 20 tablet 0   rizatriptan (MAXALT-MLT) 10 MG disintegrating tablet Take 1 tablet (10 mg total) by mouth as needed for migraine. May repeat in 2 hours if needed; max 2 per 24 hours 10 tablet 4   No current facility-administered medications for this visit.    Allergies as of 05/15/2021 - Review Complete 04/29/2021  Allergen Reaction Noted   Phentermine Anxiety 12/28/2015   Strawberry (diagnostic) Anaphylaxis 12/12/2020   Augmentin [amoxicillin-pot clavulanate] Hives 12/07/2013   Sulfa antibiotics Hives 11/29/2012   Effexor [venlafaxine] Other (See Comments) 07/20/2014    Family History  Problem Relation Age of Onset   Hypertension Father    Heart disease Father    Hyperlipidemia Father    Alcohol abuse Father    Diabetes Other    Cancer Other        lung   Colon cancer Neg Hx     Social History   Socioeconomic History   Marital status: Divorced    Spouse name: Not on file   Number of children: Not on  file   Years of education: Not on file   Highest education level: Not on file  Occupational History   Not on file  Tobacco Use   Smoking status: Never   Smokeless tobacco: Never  Substance and Sexual Activity   Alcohol use: Yes    Comment: occ   Drug use: Never   Sexual activity: Not Currently  Other Topics Concern   Not on file  Social History Narrative   Not on file   Social Determinants of Health   Financial Resource Strain: Not on file  Food Insecurity: Not on file  Transportation Needs: Not on  file  Physical Activity: Not on file  Stress: Not on file  Social Connections: Not on file    Review of Systems: Gen: Denies fever, chills, anorexia. Denies fatigue, weakness, weight loss.  CV: Denies chest pain, palpitations, syncope, peripheral edema, and claudication. Resp: Denies dyspnea at rest, cough, wheezing, coughing up blood, and pleurisy. GI: Denies vomiting blood, jaundice, and fecal incontinence.   Denies dysphagia or odynophagia. Derm: Denies rash, itching, dry skin Psych: Denies depression, anxiety, memory loss, confusion. No homicidal or suicidal ideation.  Heme: Denies bruising, bleeding, and enlarged lymph nodes.  Physical Exam: There were no vitals taken for this visit. General:   Alert and oriented. No distress noted. Pleasant and cooperative.  Head:  Normocephalic and atraumatic. Eyes:  Conjuctiva clear without scleral icterus. Mouth:  Oral mucosa pink and moist. Good dentition. No lesions. Heart:  S1, S2 present without murmurs appreciated. Lungs:  Clear to auscultation bilaterally. No wheezes, rales, or rhonchi. No distress.  Abdomen:  +BS, soft, non-tender and non-distended. No rebound or guarding. No HSM or masses noted. Msk:  Symmetrical without gross deformities. Normal posture. Extremities:  Without edema. Neurologic:  Alert and  oriented x4 Psych:  Alert and cooperative. Normal mood and affect.

## 2021-05-15 ENCOUNTER — Ambulatory Visit: Payer: BC Managed Care – PPO | Admitting: Gastroenterology

## 2021-07-30 ENCOUNTER — Other Ambulatory Visit: Payer: Self-pay | Admitting: Family Medicine

## 2021-07-30 DIAGNOSIS — Z1231 Encounter for screening mammogram for malignant neoplasm of breast: Secondary | ICD-10-CM

## 2021-09-04 ENCOUNTER — Ambulatory Visit
Admission: RE | Admit: 2021-09-04 | Discharge: 2021-09-04 | Disposition: A | Discharge status: Home / Self Care | Payer: BC Managed Care – PPO | Source: Ambulatory Visit | Attending: Family Medicine | Admitting: Family Medicine

## 2021-09-04 ENCOUNTER — Other Ambulatory Visit: Payer: Self-pay

## 2021-09-04 DIAGNOSIS — Z1231 Encounter for screening mammogram for malignant neoplasm of breast: Secondary | ICD-10-CM

## 2021-09-24 ENCOUNTER — Encounter: Payer: Self-pay | Admitting: Family Medicine

## 2021-09-24 ENCOUNTER — Ambulatory Visit: Payer: BC Managed Care – PPO | Admitting: Family Medicine

## 2021-09-24 ENCOUNTER — Other Ambulatory Visit: Payer: Self-pay

## 2021-09-24 VITALS — BP 132/86 | HR 97 | Ht 65.0 in | Wt 209.0 lb

## 2021-09-24 DIAGNOSIS — E785 Hyperlipidemia, unspecified: Secondary | ICD-10-CM | POA: Diagnosis not present

## 2021-09-24 DIAGNOSIS — M791 Myalgia, unspecified site: Secondary | ICD-10-CM | POA: Diagnosis not present

## 2021-09-24 DIAGNOSIS — E559 Vitamin D deficiency, unspecified: Secondary | ICD-10-CM | POA: Diagnosis not present

## 2021-09-24 DIAGNOSIS — R739 Hyperglycemia, unspecified: Secondary | ICD-10-CM

## 2021-09-24 DIAGNOSIS — R7301 Impaired fasting glucose: Secondary | ICD-10-CM

## 2021-09-24 DIAGNOSIS — Z1211 Encounter for screening for malignant neoplasm of colon: Secondary | ICD-10-CM

## 2021-09-24 MED ORDER — RIZATRIPTAN BENZOATE 10 MG PO TBDP: 10.0000 mg | ORAL_TABLET | ORAL | 4 refills | Status: DC | PRN

## 2021-09-24 MED ORDER — ALBUTEROL SULFATE HFA 108 (90 BASE) MCG/ACT IN AERS: 2.0000 | INHALATION_SPRAY | RESPIRATORY_TRACT | 2 refills | Status: DC | PRN

## 2021-09-24 MED ORDER — CLONAZEPAM 0.5 MG PO TABS: ORAL_TABLET | ORAL | 1 refills | Status: DC

## 2021-09-24 NOTE — Progress Notes (Signed)
° °  Subjective:    Patient ID: Sara Powers, female    DOB: [birth year], 56 y.o.   MRN: [id number]  HPI Patient arrives to discuss joint pain. Patient states she has a history of vit d deficiency and didn't know if it was time for labs. Patient would like referral to a different GI - doesn't want to return to previous GI She states she would like to get in down in [place] for her colonoscopy She does relate a lot of joint pain and discomfort and body aches that lasted about 2 weeks but then went away denies any other particular troubles  Review of Systems     Objective:   Physical Exam General-in no acute distress Eyes-no discharge Lungs-respiratory rate normal, CTA CV-no murmurs,RRR Extremities skin warm dry no edema Neuro grossly normal Behavior normal, alert        Assessment & Plan:  1. Hyperlipidemia, unspecified hyperlipidemia type Lipid panel recommended healthy diet - Lipid panel  2. Hyperglycemia Minimize starches healthy diet check labs - Hemoglobin [id number] - Basic metabolic panel  3. Vitamin D deficiency History of vitamin D deficiency check labs - VITAMIN D 25 Hydroxy (Vit-D Deficiency, Fractures)  4. Myalgia Body aches check inflammatory markers body aches have gone away doubt there is any type of major pathology no sign of rheumatoid arthritis - Sedimentation rate - C-reactive protein  5. Impaired fasting glucose Check labs - Hemoglobin A1c  6. Screening for colon cancer Referral to [facility] gastroenterology - Ambulatory referral to Gastroenterology

## 2021-09-28 LAB — BASIC METABOLIC PANEL
BUN/Creatinine Ratio: 15 (ref 9–23)
BUN: 15 mg/dL (ref 6–24)
CO2: 24 mmol/L (ref 20–29)
Calcium: 9.6 mg/dL (ref 8.7–10.2)
Chloride: 100 mmol/L (ref 96–106)
Creatinine, Ser: 0.97 mg/dL (ref 0.57–1.00)
Glucose: 111 mg/dL — ABNORMAL HIGH (ref 70–99)
Potassium: 4.4 mmol/L (ref 3.5–5.2)
Sodium: 141 mmol/L (ref 134–144)
eGFR: 70 mL/min/{1.73_m2} (ref >59)

## 2021-09-28 LAB — VITAMIN D 25 HYDROXY (VIT D DEFICIENCY, FRACTURES): Vit D, 25-Hydroxy: 30.5 ng/mL (ref 30.0–100.0)

## 2021-09-28 LAB — LIPID PANEL
Chol/HDL Ratio: 4.8 ratio — ABNORMAL HIGH (ref 0.0–4.4)
Cholesterol, Total: 215 mg/dL — ABNORMAL HIGH (ref 100–199)
HDL: 45 mg/dL (ref >39)
LDL Chol Calc (NIH): 142 mg/dL — ABNORMAL HIGH (ref 0–99)
Triglycerides: 158 mg/dL — ABNORMAL HIGH (ref 0–149)
VLDL Cholesterol Cal: 28 mg/dL (ref 5–40)

## 2021-09-28 LAB — C-REACTIVE PROTEIN: CRP: <1 mg/L (ref 0–10)

## 2021-09-28 LAB — HEMOGLOBIN A1C
Est. average glucose Bld gHb Est-mCnc: 123 mg/dL
Hgb A1c MFr Bld: 5.9 % — ABNORMAL HIGH (ref 4.8–5.6)

## 2021-09-28 LAB — SEDIMENTATION RATE: Sed Rate: 4 mm/hr (ref 0–40)

## 2021-11-29 ENCOUNTER — Encounter: Payer: Self-pay | Admitting: Nurse Practitioner

## 2021-11-29 ENCOUNTER — Other Ambulatory Visit: Payer: Self-pay

## 2021-11-29 ENCOUNTER — Ambulatory Visit: Payer: BC Managed Care – PPO | Admitting: Nurse Practitioner

## 2021-11-29 VITALS — BP 118/84 | HR 88 | Temp 97.9°F | Ht 65.0 in | Wt 212.0 lb

## 2021-11-29 DIAGNOSIS — Z1211 Encounter for screening for malignant neoplasm of colon: Secondary | ICD-10-CM | POA: Diagnosis not present

## 2021-11-29 DIAGNOSIS — F419 Anxiety disorder, unspecified: Secondary | ICD-10-CM | POA: Diagnosis not present

## 2021-11-29 DIAGNOSIS — J4520 Mild intermittent asthma, uncomplicated: Secondary | ICD-10-CM

## 2021-11-29 DIAGNOSIS — F4310 Post-traumatic stress disorder, unspecified: Secondary | ICD-10-CM | POA: Diagnosis not present

## 2021-11-29 MED ORDER — ESCITALOPRAM OXALATE 10 MG PO TABS: 10.0000 mg | ORAL_TABLET | Freq: Every day | ORAL | 2 refills | Status: DC

## 2021-11-29 MED ORDER — BUDESONIDE-FORMOTEROL FUMARATE 80-4.5 MCG/ACT IN AERO: 2.0000 | INHALATION_SPRAY | Freq: Two times a day (BID) | RESPIRATORY_TRACT | 5 refills | Status: AC
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
## 2021-11-30 ENCOUNTER — Encounter: Payer: Self-pay | Admitting: Nurse Practitioner

## 2021-12-27 ENCOUNTER — Ambulatory Visit: Payer: BC Managed Care – PPO | Admitting: Nurse Practitioner

## 2021-12-27 VITALS — BP 124/70 | HR 65 | Temp 97.7°F | Ht 65.0 in | Wt 214.0 lb

## 2021-12-27 DIAGNOSIS — R5383 Other fatigue: Secondary | ICD-10-CM | POA: Diagnosis not present

## 2021-12-27 DIAGNOSIS — Z91018 Allergy to other foods: Secondary | ICD-10-CM | POA: Diagnosis not present

## 2021-12-27 DIAGNOSIS — S20461A Insect bite (nonvenomous) of right back wall of thorax, initial encounter: Secondary | ICD-10-CM | POA: Diagnosis not present

## 2021-12-27 DIAGNOSIS — F419 Anxiety disorder, unspecified: Secondary | ICD-10-CM

## 2021-12-27 DIAGNOSIS — R0683 Snoring: Secondary | ICD-10-CM

## 2021-12-27 DIAGNOSIS — F4310 Post-traumatic stress disorder, unspecified: Secondary | ICD-10-CM

## 2021-12-27 DIAGNOSIS — W57XXXA Bitten or stung by nonvenomous insect and other nonvenomous arthropods, initial encounter: Secondary | ICD-10-CM

## 2021-12-27 NOTE — Progress Notes (Signed)
? ?  Subjective:  ? ? Patient ID: Sara Powers, female    DOB: 08/13/1966, 56 y.o.   MRN: [id number] ? ?Anxiety ?Presents for follow-up visit. Symptoms include nervous/anxious behavior. Patient reports no chest pain, shortness of breath or suicidal ideas.  ? ?Continues therapy for PTSD and anxiety. Lexapro 10 mg working well. Denies any adverse effects. Takes rare Klonopin.  ?C/o snoring and fatigue. [place] questionnaire positive for OSA screening.  ?Has had multiple tick bites which mostly cause itching at the site of the bite. No fever or other rash.  ?Has experienced significant GI symptoms whenever she eats any beef.  ?Denies any suicidal thoughts or ideation.  ? ?  12/27/2021  ?  1:10 PM 11/29/2021  ? 10:04 AM 11/01/2020  ?  2:20 PM 10/23/2017  ?  9:17 PM  ?GAD 7 : Generalized Anxiety Score  ?Nervous, Anxious, on Edge 2 2 2 1   ?Control/stop worrying 1 1 1 1   ?Worry too much - different things 1 2 2  0  ?Trouble relaxing 1 1 2 2   ?Restless 2 1 1  0  ?Easily annoyed or irritable 1 3 2 3   ?Afraid - awful might happen 0 2 2 0  ?Total GAD 7 Score 8 12 12 7   ?Anxiety Difficulty Somewhat difficult Extremely difficult Very difficult Somewhat difficult  ? ? ? ?Review of Systems  ?Constitutional:  Positive for fatigue.  ?Respiratory:  Negative for chest tightness and shortness of breath.   ?Cardiovascular:  Negative for chest pain.  ?Gastrointestinal:   ?     GI symptoms including reflux with consumption of beef  ?Psychiatric/Behavioral:  Negative for suicidal ideas. The patient is nervous/anxious.   ? ?   ?Objective:  ? Physical Exam ?NAD. Alert, oriented. Lungs clear. Heart RRR. Making good eye contact. Calm, cheerful affect. Dressed appropriately. Thoughts logical, coherent and relevant.  ?Today's Vitals  ? 12/27/21 1334  ?BP: 124/70  ?Pulse: 65  ?Temp: 97.7 ?F (36.5 ?C)  ?SpO2: 96%  ?Weight: 214 lb (97.1 kg)  ?Height: 5\' 5"  (1.651 m)  ? ?Body mass index is 35.61 kg/m?. ? ? ? ? ?   ?Assessment & Plan:  ? ?Problem List  Items Addressed This Visit   ? ?  ? Other  ? Anxiety  ? PTSD (post-traumatic stress disorder)  ? ?Other Visit Diagnoses   ? ? Tick bite of right back wall of thorax, initial encounter    -  Primary  ? Allergy to meat      ? Relevant Orders  ? Alpha-Gal Panel  ? Snoring      ? Fatigue, unspecified type      ? ?  ? ?Continue Lexapro and therapy. ?HBST ordered for possible OSA. ?Alpha gal panel ordered.  ?Encouraged healthy diet and regular activity.  ?Return in about 6 months (around 06/28/2022). ? ? ? ?

## 2021-12-28 ENCOUNTER — Encounter: Payer: Self-pay | Admitting: Nurse Practitioner

## 2021-12-31 LAB — ALPHA-GAL PANEL
Allergen Lamb IgE: 0.72 kU/L — AB
Beef IgE: 1.03 kU/L — AB
IgE (Immunoglobulin E), Serum: 64 IU/mL (ref 6–495)
O215-IgE Alpha-Gal: 2 kU/L — AB
Pork IgE: 0.44 kU/L — AB

## 2022-01-03 ENCOUNTER — Encounter: Payer: Self-pay | Admitting: Nurse Practitioner

## 2022-01-03 DIAGNOSIS — Z91014 Allergy to mammalian meats: Secondary | ICD-10-CM | POA: Insufficient documentation

## 2022-03-10 ENCOUNTER — Telehealth: Payer: Self-pay | Admitting: *Deleted

## 2022-03-10 NOTE — Telephone Encounter (Signed)
Patient calling for results of home based sleep study - patient states the company just told her they mailed the results to carolyn 02/20/22

## 2022-03-21 ENCOUNTER — Encounter: Payer: Self-pay | Admitting: Nurse Practitioner

## 2022-03-21 NOTE — Telephone Encounter (Signed)
Results came over on patient put in your red folder for review

## 2022-03-21 NOTE — Telephone Encounter (Signed)
Campbell Riches, NP     Can we find these results? Not sure if they went to the doctors. Not in my box. Thanks!

## 2022-03-21 NOTE — Telephone Encounter (Signed)
Note sent to patient through my chart to make an appointment to discuss results.

## 2022-03-23 ENCOUNTER — Telehealth: Payer: Self-pay | Admitting: Family Medicine

## 2022-03-23 NOTE — Telephone Encounter (Signed)
Please see sleep study AHI was 15.5 but patient had 4% of her time with significant hypoxia less than 88% based on this it is judged to have moderate sleep apnea and CPAP is recommended patient has been informed this Via MyChart and it is recommended for the patient to do an office visit  Please connect with patient encouraged her to do a follow-up visit within 30 days either with myself or with [name] in order to initiate CPAP etc.

## 2022-03-24 NOTE — Telephone Encounter (Signed)
See MyChart message

## 2022-04-04 ENCOUNTER — Encounter: Payer: Self-pay | Admitting: Nurse Practitioner

## 2022-04-04 ENCOUNTER — Ambulatory Visit: Payer: BC Managed Care – PPO | Admitting: Nurse Practitioner

## 2022-04-04 VITALS — BP 127/81 | HR 78 | Temp 97.7°F | Ht 65.0 in | Wt 222.8 lb

## 2022-04-04 DIAGNOSIS — E669 Obesity, unspecified: Secondary | ICD-10-CM | POA: Diagnosis not present

## 2022-04-04 DIAGNOSIS — G4733 Obstructive sleep apnea (adult) (pediatric): Secondary | ICD-10-CM

## 2022-04-04 NOTE — Progress Notes (Unsigned)
   Subjective:    Patient ID: Sara Powers, female    DOB: [birth year], 56 y.o.   MRN: [id number]  HPI Presents to review the recent findings of her home-based sleep test.  Also to look at options.  States she was recently on vacation and her mother commented on how loud her snoring was during the night. Patient is also interested in looking at options to help jumpstart her weight loss.  Her insurance may pay for a GLP-1 but cost may be an issue.  Is interested in trying phentermine, notes she has had a problem with the maximum dose in the past.   Review of Systems  Constitutional:  Positive for fatigue.  Respiratory:  Negative for chest tightness and shortness of breath.   Cardiovascular:  Negative for chest pain, palpitations and leg swelling.       Objective:   Physical Exam NAD.  Alert, oriented.  Lungs clear.  Heart regular rate and rhythm.  Lower extremities no edema. Today's Vitals   04/04/22 1444  BP: 127/81  Pulse: 78  Temp: 97.7 F (36.5 C)  TempSrc: Temporal  SpO2: 97%  Weight: 222 lb 12.8 oz (101.1 kg)  Height: 5\' 5"  (1.651 m)   Body mass index is 37.08 kg/m. See scanned home-based sleep test results.  Indicates she has mild to moderate sleep apnea.       Assessment & Plan:   Problem List Items Addressed This Visit       Respiratory   OSA (obstructive sleep apnea) - Primary     Other   Obesity (BMI 35.0-39.9 without comorbidity)   Patient will look into the preferred medications through her insurance.  May like to retry phentermine if possible due to cost, see if a lower dose is better tolerated.  We will get back with 08-08-1990 soon regarding this. We will refer for CPAP with auto titration and supplies. If she starts weight loss medication, follow-up in 1 month.  Otherwise routine follow-up in 6 months.

## 2022-04-05 ENCOUNTER — Encounter: Payer: Self-pay | Admitting: Nurse Practitioner

## 2022-04-05 ENCOUNTER — Other Ambulatory Visit: Payer: Self-pay | Admitting: Nurse Practitioner

## 2022-04-05 DIAGNOSIS — E669 Obesity, unspecified: Secondary | ICD-10-CM | POA: Insufficient documentation

## 2022-04-05 MED ORDER — PHENTERMINE HCL 15 MG PO CAPS: 15.0000 mg | ORAL_CAPSULE | ORAL | 0 refills | Status: DC

## 2022-05-02 ENCOUNTER — Ambulatory Visit: Payer: BC Managed Care – PPO | Admitting: Nurse Practitioner

## 2022-05-02 VITALS — BP 110/60 | HR 79 | Temp 97.3°F | Ht 65.0 in | Wt 222.0 lb

## 2022-05-02 DIAGNOSIS — F419 Anxiety disorder, unspecified: Secondary | ICD-10-CM | POA: Diagnosis not present

## 2022-05-02 DIAGNOSIS — E669 Obesity, unspecified: Secondary | ICD-10-CM | POA: Diagnosis not present

## 2022-05-02 DIAGNOSIS — Z23 Encounter for immunization: Secondary | ICD-10-CM

## 2022-05-02 DIAGNOSIS — G4733 Obstructive sleep apnea (adult) (pediatric): Secondary | ICD-10-CM | POA: Diagnosis not present

## 2022-05-02 DIAGNOSIS — S90511A Abrasion, right ankle, initial encounter: Secondary | ICD-10-CM

## 2022-05-02 MED ORDER — PHENTERMINE HCL 15 MG PO CAPS: 15.0000 mg | ORAL_CAPSULE | ORAL | 2 refills | Status: DC

## 2022-05-02 MED ORDER — ESCITALOPRAM OXALATE 10 MG PO TABS: 10.0000 mg | ORAL_TABLET | Freq: Every day | ORAL | 1 refills | Status: DC

## 2022-05-02 NOTE — Progress Notes (Unsigned)
Subjective:    Patient ID: Sara Powers, female    DOB: 08-13-1966, 56 y.o.   MRN: [id number]  HPI 1 month sleep apnea and weight loss medicine follow up. Patient has not heard from the company regarding her CPAP and supplies, another order will be sent in today to local medical supply.  States she is having loud snoring and constant fatigue. Continues to take escitalopram 10 mg daily, states this is working well. Started her phentermine 15 mg about 2 weeks ago, denies any side effects including chest pain shortness of breath or palpitations.  No change in her sleep patterns, continues to have difficulty sleeping due to sleep apnea. Has made some dietary changes including cutting back on her Pepsi's and drinking more water.  Also has been eating more of a Mediterranean diet is much as possible. Activity has been somewhat limited because she stays tired all the time. It was noted on exam that she has some superficial abrasions to her right ankle and her left great toe which she states comes from having and some briars.  Has not had a recent tetanus vaccine.    05/02/2022    9:18 AM  Depression screen PHQ 2/9  Decreased Interest 0  Down, Depressed, Hopeless 0  PHQ - 2 Score 0        Objective:   Physical Exam NAD.  Alert, oriented.  Thyroid nontender to palpation, no mass or goiter noted.  Lungs clear.  Heart regular rate rhythm.  Lower extremities no edema.  Several superficial abrasions noted on the right ankle area and 1 on the left great toe.  No evidence of infection. Today's Vitals   05/02/22 0912  BP: 110/60  Pulse: 79  Temp: (!) 97.3 F (36.3 C)  SpO2: 96%  Weight: 222 lb (100.7 kg)  Height: 5\' 5"  (1.651 m)   Body mass index is 36.94 kg/m.        Assessment & Plan:   Problem List Items Addressed This Visit       Respiratory   OSA (obstructive sleep apnea) - Primary     Other   Anxiety   Relevant Medications   escitalopram (LEXAPRO) 10 MG tablet    Obesity (BMI 35.0-39.9 without comorbidity)   Relevant Medications   phentermine 15 MG capsule   Other Visit Diagnoses     Abrasion of right ankle, initial encounter       Relevant Orders   Tdap vaccine greater than or equal to 7yo IM (Completed)        Meds ordered this encounter  Medications   escitalopram (LEXAPRO) 10 MG tablet    Sig: Take 1 tablet (10 mg total) by mouth daily.    Dispense:  90 tablet    Refill:  1   phentermine 15 MG capsule    Sig: Take 1 capsule (15 mg total) by mouth every morning.    Dispense:  30 capsule    Refill:  2    She is taking a lower dose; had issues with the maximum dose in the past    Order Specific Question:   Supervising Provider    Answer:   08-08-1990 A [[id number]]   Continue escitalopram as directed. Continue phentermine as directed.  Encourage patient to continue healthy lifestyle habits. A new order was sent in for her CPAP machine and supplies.  Once she has her sleep apnea under better control, her goal is to increase her activity once her energy has improved.  Tdap vaccine today.  Contact office if any signs of infection in her abrasions. Return in about 3 months (around 08/02/2022). Recommend flu vaccine this fall.

## 2022-05-03 ENCOUNTER — Encounter: Payer: Self-pay | Admitting: Nurse Practitioner

## 2022-07-21 ENCOUNTER — Encounter: Payer: Self-pay | Admitting: Family Medicine

## 2022-07-21 ENCOUNTER — Ambulatory Visit (INDEPENDENT_AMBULATORY_CARE_PROVIDER_SITE_OTHER): Payer: BC Managed Care – PPO | Admitting: Family Medicine

## 2022-07-21 VITALS — BP 135/78 | HR 88 | Temp 98.1°F | Wt 216.8 lb

## 2022-07-21 DIAGNOSIS — B9689 Other specified bacterial agents as the cause of diseases classified elsewhere: Secondary | ICD-10-CM

## 2022-07-21 DIAGNOSIS — J019 Acute sinusitis, unspecified: Secondary | ICD-10-CM | POA: Diagnosis not present

## 2022-07-21 MED ORDER — DOXYCYCLINE HYCLATE 100 MG PO TABS: 100.0000 mg | ORAL_TABLET | Freq: Two times a day (BID) | ORAL | 0 refills | Status: DC

## 2022-07-21 NOTE — Progress Notes (Signed)
Subjective:  Patient ID: Sara Powers, female    DOB: 1966-04-08  Age: 56 y.o. MRN: [id number]  CC: Chief Complaint  Patient presents with   Sore Throat    Pt arrives with sore throat, hoarseness, green mucus, sinus pressure, something stuck in throat     HPI:  56 year old female presents for evaluation the above.  Patient reports that she has been sick for over a week.  Has had sore throat, congestion, hoarseness sinus pain and pressure.  Initially was doing okay but now has acutely worsened as of this weekend.  She is having severe sinus pain and pressure.  She has had negative home COVID testing.  No fever.  Has been using over-the-counter treatment without relief.  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   PTSD (post-traumatic stress disorder) 11/29/2021   Panic attacks 02/09/2019   Vitamin D deficiency 10/26/2017   Migraine 07/14/2017   Rosacea 03/27/2017   Impaired fasting glucose 06/14/2014   Anxiety 09/08/2013   Abnormal mammogram 11/30/2012   Insulin resistance    Asthma     Social Hx   Social History   Socioeconomic History   Marital status: Divorced    Spouse name: Not on file   Number of children: Not on file   Years of education: Not on file   Highest education level: Not on file  Occupational History   Not on file  Tobacco Use   Smoking status: Never   Smokeless tobacco: Never  Substance and Sexual Activity   Alcohol use: Yes    Comment: occ   Drug use: Never   Sexual activity: Not Currently  Other Topics Concern   Not on file  Social History Narrative   Not on file   Social Determinants of Health   Financial Resource Strain: Not on file  Food Insecurity: Not on file  Transportation Needs: Not on file  Physical Activity: Not on file  Stress:  Not on file  Social Connections: Not on file    Review of Systems Per HPI  Objective:  BP 135/78   Pulse 88   Temp 98.1 F (36.7 C)   Wt 216 lb 12.8 oz (98.3 kg)   SpO2 97%   BMI 36.08 kg/m      07/21/2022    3:37 PM 05/02/2022    9:12 AM 04/04/2022    2:44 PM  BP/Weight  Systolic BP 135 110 127  Diastolic BP 78 60 81  Wt. (Lbs) 216.8 222 222.8  BMI 36.08 kg/m2 36.94 kg/m2 37.08 kg/m2    Physical Exam Vitals and nursing note reviewed.  Constitutional:      General: She is not in acute distress.    Appearance: Normal appearance. She is obese.  HENT:     Head: Normocephalic and atraumatic.     Right Ear: Tympanic membrane normal.     Left Ear: Tympanic membrane normal.     Nose:     Comments: Tenderness over the maxillary sinuses.    Mouth/Throat:     Pharynx: Oropharynx is clear.  Cardiovascular:     Rate and Rhythm: Normal rate and regular rhythm.  Pulmonary:     Effort: Pulmonary effort is normal.     Breath sounds: Normal breath sounds. No wheezing, rhonchi or rales.  Neurological:  Mental Status: She is alert.     Lab Results  Component Value Date   WBC 7.8 09/14/2019   HGB 13.5 09/14/2019   HCT 40.6 09/14/2019   PLT 335 09/14/2019   GLUCOSE 111 (H) 09/27/2021   CHOL 215 (H) 09/27/2021   TRIG 158 (H) 09/27/2021   HDL 45 09/27/2021   LDLCALC 142 (H) 09/27/2021   ALT 18 09/14/2019   AST 18 09/14/2019   NA 141 09/27/2021   K 4.4 09/27/2021   CL 100 09/27/2021   CREATININE 0.97 09/27/2021   BUN 15 09/27/2021   CO2 24 09/27/2021   TSH 2.510 09/14/2019   HGBA1C 5.9 (H) 09/27/2021     Assessment & Plan:   Problem List Items Addressed This Visit       Respiratory   Acute bacterial rhinosinusitis - Primary    Treating with Doxycycline (Has allergy to Augmentin).      Relevant Medications   doxycycline (VIBRA-TABS) 100 MG tablet    Meds ordered this encounter  Medications   doxycycline (VIBRA-TABS) 100 MG tablet    Sig: Take 1  tablet (100 mg total) by mouth 2 (two) times daily.    Dispense:  14 tablet    Refill:  0    Follow-up:  Return if symptoms worsen or fail to improve.  Everlene Other DO [facility]

## 2022-07-21 NOTE — Assessment & Plan Note (Signed)
Treating with Doxycycline (Has allergy to Augmentin).

## 2022-08-28 ENCOUNTER — Ambulatory Visit: Payer: BC Managed Care – PPO | Admitting: Family Medicine

## 2022-08-28 ENCOUNTER — Encounter: Payer: Self-pay | Admitting: Family Medicine

## 2022-08-28 VITALS — BP 110/72 | HR 87 | Temp 97.3°F | Ht 65.0 in | Wt 212.0 lb

## 2022-08-28 DIAGNOSIS — F4310 Post-traumatic stress disorder, unspecified: Secondary | ICD-10-CM

## 2022-08-28 DIAGNOSIS — E88819 Insulin resistance, unspecified: Secondary | ICD-10-CM | POA: Diagnosis not present

## 2022-08-28 DIAGNOSIS — F5081 Binge eating disorder: Secondary | ICD-10-CM

## 2022-08-28 DIAGNOSIS — R7301 Impaired fasting glucose: Secondary | ICD-10-CM

## 2022-08-28 DIAGNOSIS — E559 Vitamin D deficiency, unspecified: Secondary | ICD-10-CM

## 2022-08-28 DIAGNOSIS — E785 Hyperlipidemia, unspecified: Secondary | ICD-10-CM

## 2022-08-28 MED ORDER — ESCITALOPRAM OXALATE 5 MG PO TABS
10.0000 mg | ORAL_TABLET | Freq: Every day | ORAL | 1 refills | Status: DC
Start: 2022-08-28 — End: 2023-05-22

## 2022-08-28 MED ORDER — LISDEXAMFETAMINE DIMESYLATE 30 MG PO CAPS: 30.0000 mg | ORAL_CAPSULE | Freq: Every day | ORAL | 0 refills | Status: DC
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
## 2022-09-12 LAB — BASIC METABOLIC PANEL
BUN/Creatinine Ratio: 14 (ref 9–23)
BUN: 10 mg/dL (ref 6–24)
CO2: 25 mmol/L (ref 20–29)
Calcium: 9.2 mg/dL (ref 8.7–10.2)
Chloride: 102 mmol/L (ref 96–106)
Creatinine, Ser: 0.72 mg/dL (ref 0.57–1.00)
Glucose: 97 mg/dL (ref 70–99)
Potassium: 4.2 mmol/L (ref 3.5–5.2)
Sodium: 139 mmol/L (ref 134–144)
eGFR: 99 mL/min/{1.73_m2} (ref >59)

## 2022-09-12 LAB — LIPID PANEL
Chol/HDL Ratio: 4.8 ratio — ABNORMAL HIGH (ref 0.0–4.4)
Cholesterol, Total: 214 mg/dL — ABNORMAL HIGH (ref 100–199)
HDL: 45 mg/dL (ref >39)
LDL Chol Calc (NIH): 127 mg/dL — ABNORMAL HIGH (ref 0–99)
Triglycerides: 237 mg/dL — ABNORMAL HIGH (ref 0–149)
VLDL Cholesterol Cal: 42 mg/dL — ABNORMAL HIGH (ref 5–40)

## 2022-09-12 LAB — VITAMIN D 25 HYDROXY (VIT D DEFICIENCY, FRACTURES): Vit D, 25-Hydroxy: 19.4 ng/mL — ABNORMAL LOW (ref 30.0–100.0)

## 2022-09-12 LAB — HEMOGLOBIN A1C
Est. average glucose Bld gHb Est-mCnc: 120 mg/dL
Hgb A1c MFr Bld: 5.8 % — ABNORMAL HIGH (ref 4.8–5.6)

## 2022-09-15 ENCOUNTER — Telehealth: Payer: Self-pay | Admitting: *Deleted

## 2022-09-15 ENCOUNTER — Other Ambulatory Visit: Payer: Self-pay

## 2022-09-15 DIAGNOSIS — E559 Vitamin D deficiency, unspecified: Secondary | ICD-10-CM

## 2022-09-15 MED ORDER — VITAMIN D (ERGOCALCIFEROL) 1.25 MG (50000 UNIT) PO CAPS: 50000.0000 [IU] | ORAL_CAPSULE | ORAL | 0 refills | Status: DC

## 2022-09-15 NOTE — Telephone Encounter (Signed)
I recommend vitamin D 50,000 units 1/week, #12, no refills We will discuss further on follow-up

## 2022-09-15 NOTE — Telephone Encounter (Signed)
Patient states she saw where her Vit D is low again and she is having severe joint pains/muscle aches and fatigue and wanting to sleep all the time and she can't even pick things up . Patient states she would like to go ahead and be treated for the Vit D ASAP because she does not think she can wait till her follow up next week for the script  Patient would like it sent to [facility] and stated she will keep he follow up to discuss the rest of labs

## 2022-09-15 NOTE — Telephone Encounter (Signed)
Message left to inform patient rx sent in.

## 2022-09-29 ENCOUNTER — Ambulatory Visit: Payer: BC Managed Care – PPO | Admitting: Family Medicine

## 2022-09-29 VITALS — BP 124/82 | HR 93 | Temp 97.2°F | Ht 65.0 in | Wt 215.0 lb

## 2022-09-29 DIAGNOSIS — E559 Vitamin D deficiency, unspecified: Secondary | ICD-10-CM | POA: Diagnosis not present

## 2022-09-29 DIAGNOSIS — M791 Myalgia, unspecified site: Secondary | ICD-10-CM

## 2022-09-29 DIAGNOSIS — F5081 Binge eating disorder: Secondary | ICD-10-CM | POA: Diagnosis not present

## 2022-09-29 MED ORDER — LISDEXAMFETAMINE DIMESYLATE 30 MG PO CAPS: 30.0000 mg | ORAL_CAPSULE | Freq: Every day | ORAL | 0 refills | Status: DC

## 2022-09-29 NOTE — Progress Notes (Signed)
   Subjective:    Patient ID: Sara Powers, female    DOB: 07/27/1966, 57 y.o.   MRN: [id number]  HPI PTSD follow up  2 week ago injury to arms, still having trouble  Here today for follow-up regarding Vyvanse It is helping her with cutting down on binge eating It is also helping her with ADD symptoms She denies any negative side effects.  Review of Systems     Objective:   Physical Exam General-in no acute distress Eyes-no discharge Lungs-respiratory rate normal, CTA CV-no murmurs,RRR Extremities skin warm dry no edema Neuro grossly normal Behavior normal, alert        Assessment & Plan:  1. Vitamin D deficiency Recheck this again in 4 to 6 weeks continue vitamin D supplement - Vitamin D, 25-hydroxy - CK - Sedimentation Rate  2. Binge eating disorder Vyvanse 3 prescriptions given Tolerating well Follow-up in 3 and half months It is reducing her binge eating in actually helping her stay more focused - Vitamin D, 25-hydroxy - CK - Sedimentation Rate  3. Myalgia Patient does have intermittent myalgias in her forearms over the past few weeks if these persist she will let us know check a creatinine kinase - Vitamin D, 25-hydroxy - CK - Sedimentation Rate

## 2022-10-27 ENCOUNTER — Ambulatory Visit
Admission: RE | Admit: 2022-10-27 | Discharge: 2022-10-27 | Disposition: A | Discharge status: Home / Self Care | Payer: BC Managed Care – PPO | Source: Ambulatory Visit | Attending: Nurse Practitioner | Admitting: Nurse Practitioner

## 2022-10-27 VITALS — BP 134/80 | HR 102 | Temp 98.3°F | Resp 20

## 2022-10-27 DIAGNOSIS — J020 Streptococcal pharyngitis: Secondary | ICD-10-CM | POA: Diagnosis not present

## 2022-10-27 LAB — POCT RAPID STREP A (OFFICE): Rapid Strep A Screen: POSITIVE — AB

## 2022-10-27 MED ORDER — CEPHALEXIN 500 MG PO CAPS: 500.0000 mg | ORAL_CAPSULE | Freq: Two times a day (BID) | ORAL | 0 refills | Status: AC

## 2022-10-27 MED ORDER — LIDOCAINE VISCOUS HCL 2 % MT SOLN: 15.0000 mL | OROMUCOSAL | 0 refills | Status: DC | PRN

## 2022-10-27 NOTE — ED Triage Notes (Signed)
Pt has been exposed to strep now it is hard for her to swallow, swollen glands, fever, right ear pain, and headache x 2 days

## 2022-10-27 NOTE — Discharge Instructions (Addendum)
You tested positive for strep throat today.  Please take the Keflex as prescribed to treat it.  You can use the lidocaine rinses as needed to help with throat pain.  Make sure you change your toothbrush today or tomorrow after starting treatment to prevent reinfection.  Seek care if symptoms persist or worsen despite treatment.

## 2022-10-27 NOTE — ED Provider Notes (Signed)
[facility]    CSN: [id number] Arrival date & time: 10/27/22  1145      History   Chief Complaint Chief Complaint  Patient presents with   Sore Throat    FeverSwollen glands in neck - Entered by patient    HPI Sara Powers is a 57 y.o. female.   Patient presents today for 1 day history of fever, body aches and chills, sore throat, swollen glands in her neck.  She denies cough, chest pain, shortness of breath, abdominal pain, nausea/vomiting, and diarrhea.  Reports her appetite is decreased because it hurts to swallow.  Also reports the pain in her neck is radiating up to her right ear.  Reports last week she watched a child who tested positive for strep throat.  Patient denies antibiotic use in the past 90 days.  Reports allergy to Augmentin that she believes was GI upset.  Reports she has tolerated cephalosporins well in the past.    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      Home Medications    Prior to Admission medications   Medication Sig Start Date End Date Taking? Authorizing Provider  cephALEXin (KEFLEX) 500 MG capsule Take 1 capsule (500 mg total) by mouth 2 (two) times daily for 10 days.  10/27/22 11/06/22 Yes Noemi Chapel A, NP  lidocaine (XYLOCAINE) 2 % solution Use as directed 15 mLs in the mouth or throat as needed for mouth pain. Gargle and spit as needed for throat pain 10/27/22  Yes Noemi Chapel A, NP  albuterol (VENTOLIN HFA) 108 (90 Base) MCG/ACT inhaler Inhale 2 puffs into the lungs every 4 (four) hours as needed for wheezing or shortness of breath. 09/24/21   Kathyrn Drown, MD  budesonide-formoterol (SYMBICORT) 80-4.5 MCG/ACT inhaler Inhale 2 puffs into the lungs 2 (two) times daily. To prevent wheezing 11/29/21   Nilda Simmer, NP  escitalopram (LEXAPRO) 5 MG tablet Take 2 tablets (10 mg total) by mouth daily. 08/28/22   Kathyrn Drown, MD  lisdexamfetamine (VYVANSE) 30 MG capsule Take 1 capsule (30 mg total) by mouth daily. 09/29/22   Kathyrn Drown, MD  lisdexamfetamine (VYVANSE) 30 MG capsule Take 1 capsule (30 mg total) by mouth daily. 09/29/22   Kathyrn Drown, MD  lisdexamfetamine (VYVANSE) 30 MG capsule Take 1 capsule (30 mg total) by mouth daily. 09/29/22   Kathyrn Drown, MD  Vitamin D, Ergocalciferol, (DRISDOL) 1.25 MG (50000 UNIT) CAPS capsule Take 1 capsule (50,000 Units total) by mouth every 7 (seven) days. 09/15/22   Kathyrn Drown, MD    Family History Family History  Problem Relation Age of Onset  Hypertension Father    Heart disease Father    Hyperlipidemia Father    Alcohol abuse Father    Diabetes Other    Cancer Other        lung   Colon cancer Neg Hx    Breast cancer Neg Hx     Social History Social History   Tobacco Use   Smoking status: Never   Smokeless tobacco: Never  Substance Use Topics   Alcohol use: Yes    Comment: occ   Drug use: Never     Allergies   Phentermine, Strawberry (diagnostic), Alpha-gal, Augmentin [amoxicillin-pot clavulanate], Sulfa antibiotics, and Effexor [venlafaxine]   Review of Systems Review of Systems Per HPI  Physical Exam Triage Vital Signs ED Triage Vitals  Enc Vitals Group      BP 10/27/22 1248 134/80     Pulse Rate 10/27/22 1248 (!) 102     Resp 10/27/22 1248 20     Temp 10/27/22 1248 98.3 F (36.8 C)     Temp Source 10/27/22 1248 Oral     SpO2 10/27/22 1248 98 %     Weight --      Height --      Head Circumference --      Peak Flow --      Pain Score 10/27/22 1249 10     Pain Loc --      Pain Edu? --      Excl. in [place]? --    No data found.  Updated Vital Signs BP 134/80 (BP Location: Right Arm)   Pulse (!) 102   Temp 98.3 F (36.8 C) (Oral)   Resp 20   SpO2 98%   Visual Acuity Right Eye Distance:   Left Eye Distance:   Bilateral Distance:    Right Eye Near:   Left Eye Near:    Bilateral Near:     Physical Exam Vitals and nursing note reviewed.  Constitutional:      General: She is not in acute distress.    Appearance: She is well-developed. She is not toxic-appearing.  HENT:     Head: Normocephalic and atraumatic.     Right Ear: Tympanic membrane and ear canal normal. No drainage, swelling or tenderness. No middle ear effusion. Tympanic membrane is not erythematous.     Left Ear: Tympanic membrane and ear canal normal. No drainage, swelling or tenderness.  No middle ear effusion. Tympanic membrane is not erythematous.     Nose: No congestion or rhinorrhea.     Mouth/Throat:     Mouth: Mucous membranes are moist.     Pharynx: Oropharynx is clear. Uvula midline. Posterior oropharyngeal erythema present. No oropharyngeal exudate.     Tonsils: No tonsillar exudate. 2+ on the right. 2+ on the left.  Eyes:     Extraocular Movements:     Right eye: Normal extraocular motion.     Left eye: Normal extraocular motion.  Cardiovascular:     Rate and Rhythm: Normal rate and regular rhythm.  Pulmonary:     Effort: Pulmonary effort is normal. No respiratory distress.     Breath sounds: Normal breath sounds. No wheezing, rhonchi or rales.  Musculoskeletal:     Cervical back: Normal range of motion and neck supple.  Lymphadenopathy:      Cervical: Cervical adenopathy present.  Skin:    General: Skin is warm and dry.     Capillary Refill: Capillary refill takes less than 2 seconds.     Coloration: Skin  is not pale.     Findings: No erythema or rash.  Neurological:     Mental Status: She is alert and oriented to person, place, and time.  Psychiatric:        Behavior: Behavior is cooperative.      UC Treatments / Results  Labs (all labs ordered are listed, but only abnormal results are displayed) Labs Reviewed  POCT RAPID STREP A (OFFICE) - Abnormal; Notable for the following components:      Result Value   Rapid Strep A Screen Positive (*)    All other components within normal limits    EKG   Radiology No results found.  Procedures Procedures (including critical care time)  Medications Ordered in UC Medications - No data to display  Initial Impression / Assessment and Plan / UC Course  I have reviewed the triage vital signs and the nursing notes.  Pertinent labs & imaging results that were available during my care of the patient were reviewed by me and considered in my medical decision making (see chart for details).   Patient is well-appearing, normotensive, afebrile,  not tachypneic, oxygenating well on room air.  Patient is mildly tachycardic in triage - likely secondary to acute illness.  1. Strep pharyngitis Treat with Keflex 500 mg twice daily for 10 days Other supportive care discussed including continuing Tylenol/Motrin, lidocaine rinses Change toothbrush after starting treatment ER and return precautions discussed with patient Note given for work  The patient was given the opportunity to ask questions.  All questions answered to their satisfaction.  The patient is in agreement to this plan.    Final Clinical Impressions(s) / UC Diagnoses   Final diagnoses:  Strep pharyngitis     Discharge Instructions      You tested positive for strep throat today.  Please take the Keflex as  prescribed to treat it.  You can use the lidocaine rinses as needed to help with throat pain.  Make sure you change your toothbrush today or tomorrow after starting treatment to prevent reinfection.  Seek care if symptoms persist or worsen despite treatment.    ED Prescriptions     Medication Sig Dispense Auth. Provider   cephALEXin (KEFLEX) 500 MG capsule Take 1 capsule (500 mg total) by mouth 2 (two) times daily for 10 days. 20 capsule Noemi Chapel A, NP   lidocaine (XYLOCAINE) 2 % solution Use as directed 15 mLs in the mouth or throat as needed for mouth pain. Gargle and spit as needed for throat pain 100 mL Eulogio Bear, NP      PDMP not reviewed this encounter.   Eulogio Bear, NP 10/27/22 1321

## 2023-01-02 ENCOUNTER — Ambulatory Visit: Payer: BC Managed Care – PPO | Admitting: Nurse Practitioner

## 2023-01-02 VITALS — BP 134/89 | Ht 65.0 in | Wt 213.8 lb

## 2023-01-02 DIAGNOSIS — R5382 Chronic fatigue, unspecified: Secondary | ICD-10-CM

## 2023-01-02 DIAGNOSIS — M2550 Pain in unspecified joint: Secondary | ICD-10-CM | POA: Diagnosis not present

## 2023-01-02 DIAGNOSIS — M791 Myalgia, unspecified site: Secondary | ICD-10-CM | POA: Diagnosis not present

## 2023-01-02 DIAGNOSIS — H04123 Dry eye syndrome of bilateral lacrimal glands: Secondary | ICD-10-CM

## 2023-01-02 DIAGNOSIS — E559 Vitamin D deficiency, unspecified: Secondary | ICD-10-CM

## 2023-01-02 NOTE — Progress Notes (Unsigned)
Subjective:    Patient ID: Sara Powers, female    DOB: Sep 19, 1965, 57 y.o.   MRN: [id number]  HPI  Patient arrives with joint pain, fatigue, body aches- her arms are sore to the touch. Patient requesting thyroid blood work Presents for complaints of a long term history of fatigue achiness in the muscles and joint pain.  Migratory pain.  Recently had severe pain in both arms where it was difficult to lift a 2 L bottle of soda.  Has significantly impacted her life at times.  States she feels "weak and tired".  Has a history of vitamin D deficiency, was on prescription vitamin D for 3 months and now takes vitamin D with magnesium daily as a supplement.  Other symptoms she has noticed includes brittle nails with easy breakage.  Slight dizziness at times, no syncope.  No erythema or warmth of the joints.  Minimal joint swelling.  Has also involved her lower extremities particularly the knees.  Has noted significant hair loss which is also been noticed by her hairdresser.  Has been diagnosed with itchy "dry eyes" by her eye doctor.  Has tried OTC products as recommended with no relief.  No fever or rash.  Has minimal rosacea noted on the nose area only.  No other rashes on the face.   Review of Systems  Constitutional:  Positive for fatigue. Negative for fever.  HENT:  Negative for sore throat and trouble swallowing. Sneezing: .probdiag.  Respiratory:  Negative for cough, chest tightness and shortness of breath.   Cardiovascular:  Negative for chest pain and leg swelling.  Musculoskeletal:  Positive for arthralgias and myalgias. Negative for joint swelling.  Skin:  Negative for rash.       Objective:   Physical Exam NAD.  Alert, oriented.  Calm cheerful affect.  Conjunctiva mildly injected around the periphery of both eyes.  Thyroid nontender to palpation, no mass or goiter noted.  Lungs clear.  Heart regular rate rhythm.  No nodularity or significant edema noted of the joints in the fingers.   No erythema or warmth.  Minimal pink discoloration on the tip of the nose otherwise no rash is noted. Today's Vitals   01/02/23 1602  BP: 134/89  Weight: 213 lb 12.8 oz (97 kg)  Height: 5\' 5"  (1.651 m)   Body mass index is 35.58 kg/m.        Assessment & Plan:   Problem List Items Addressed This Visit       Other   Vitamin D deficiency   Other Visit Diagnoses     Chronic fatigue    -  Primary   Relevant Orders   Rheumatoid factor (Completed)   ANA (Completed)   Sedimentation Rate (Completed)   C-reactive protein (Completed)   CBC with Differential (Completed)   T4, free (Completed)   TSH (Completed)   CYCLIC CITRUL PEPTIDE ANTIBODY, IGG/IGA (Completed)   Myalgia       Relevant Orders   Rheumatoid factor (Completed)   ANA (Completed)   Sedimentation Rate (Completed)   C-reactive protein (Completed)   CBC with Differential (Completed)   CYCLIC CITRUL PEPTIDE ANTIBODY, IGG/IGA (Completed)   Arthralgia of multiple sites, bilateral       Relevant Orders   Rheumatoid factor (Completed)   ANA (Completed)   Sedimentation Rate (Completed)   C-reactive protein (Completed)   CBC with Differential (Completed)   CYCLIC CITRUL PEPTIDE ANTIBODY, IGG/IGA (Completed)   Chronically dry eyes, bilateral  Relevant Orders   Sedimentation Rate (Completed)   C-reactive protein (Completed)   CBC with Differential (Completed)      It is noted in the chart that his C-reactive protein and ESR have been ordered several times since 2021.  Screening lab work ordered for inflammatory disorders including RA and lupus. Continue OTC vitamin D supplement daily. Follow-up depending on results. Call back in the meantime if any new or worsening symptoms. Encouraged regular activity with an anti-inflammatory hold food/plant-based diet.

## 2023-01-03 ENCOUNTER — Encounter: Payer: Self-pay | Admitting: Nurse Practitioner

## 2023-01-03 LAB — CBC WITH DIFFERENTIAL/PLATELET
Eos: 2 %
Immature Grans (Abs): 0 10*3/uL (ref 0.0–0.1)
Monocytes Absolute: 0.7 10*3/uL (ref 0.1–0.9)
Monocytes: 8 %
Neutrophils Absolute: 4.4 10*3/uL (ref 1.4–7.0)

## 2023-01-03 LAB — SEDIMENTATION RATE: Sed Rate: 13 mm/hr (ref 0–40)

## 2023-01-03 LAB — T4, FREE: Free T4: 1.04 ng/dL (ref 0.82–1.77)

## 2023-01-07 LAB — CBC WITH DIFFERENTIAL/PLATELET
Basophils Absolute: 0.1 10*3/uL (ref 0.0–0.2)
Basos: 1 %
EOS (ABSOLUTE): 0.2 10*3/uL (ref 0.0–0.4)
Hematocrit: 39.5 % (ref 34.0–46.6)
Hemoglobin: 13.4 g/dL (ref 11.1–15.9)
Immature Granulocytes: 0 %
Lymphocytes Absolute: 3.2 10*3/uL — ABNORMAL HIGH (ref 0.7–3.1)
Lymphs: 37 %
MCH: 29.8 pg (ref 26.6–33.0)
MCHC: 33.9 g/dL (ref 31.5–35.7)
MCV: 88 fL (ref 79–97)
Neutrophils: 52 %
Platelets: 318 10*3/uL (ref 150–450)
RBC: 4.5 x10E6/uL (ref 3.77–5.28)
RDW: 12.9 % (ref 11.7–15.4)
WBC: 8.4 10*3/uL (ref 3.4–10.8)

## 2023-01-07 LAB — C-REACTIVE PROTEIN: CRP: 1 mg/L (ref 0–10)

## 2023-01-07 LAB — TSH: TSH: 3.71 u[IU]/mL (ref 0.450–4.500)

## 2023-01-07 LAB — ANA: Anti Nuclear Antibody (ANA): NEGATIVE

## 2023-01-07 LAB — RHEUMATOID FACTOR: Rheumatoid fact SerPl-aCnc: <10 IU/mL (ref <14.0)

## 2023-01-07 LAB — CYCLIC CITRUL PEPTIDE ANTIBODY, IGG/IGA: Cyclic Citrullin Peptide Ab: 29 units — ABNORMAL HIGH (ref 0–19)

## 2023-01-14 ENCOUNTER — Encounter: Payer: Self-pay | Admitting: Family Medicine

## 2023-01-14 ENCOUNTER — Ambulatory Visit: Payer: BC Managed Care – PPO | Admitting: Family Medicine

## 2023-01-14 VITALS — BP 138/84 | HR 83 | Ht 65.0 in | Wt 212.8 lb

## 2023-01-14 DIAGNOSIS — F5081 Binge eating disorder: Secondary | ICD-10-CM | POA: Diagnosis not present

## 2023-01-14 DIAGNOSIS — M7711 Lateral epicondylitis, right elbow: Secondary | ICD-10-CM | POA: Diagnosis not present

## 2023-01-14 DIAGNOSIS — M2550 Pain in unspecified joint: Secondary | ICD-10-CM | POA: Diagnosis not present

## 2023-01-14 DIAGNOSIS — M7712 Lateral epicondylitis, left elbow: Secondary | ICD-10-CM

## 2023-01-14 MED ORDER — LISDEXAMFETAMINE DIMESYLATE 30 MG PO CAPS: 30.0000 mg | ORAL_CAPSULE | Freq: Every day | ORAL | 0 refills | Status: DC

## 2023-01-14 MED ORDER — LISDEXAMFETAMINE DIMESYLATE 30 MG PO CAPS: ORAL_CAPSULE | ORAL | 0 refills | Status: DC

## 2023-01-14 NOTE — Progress Notes (Unsigned)
Subjective:    Patient ID: Sara Powers, female    DOB: May 23, 1966, 57 y.o.   MRN: [id number]  HPI Patient arrives today for 5 month follow up for Vyvanse. Patient would like to discuss latest lab work she had. We did discuss the lab results from recent showing antibody elevation but sed rate rheumatoid factor all normal Results for orders placed or performed in visit on 01/02/23  Rheumatoid factor  Result Value Ref Range   Rheumatoid fact SerPl-aCnc <10.0 <14.0 IU/mL  ANA  Result Value Ref Range   Anti Nuclear Antibody (ANA) Negative Negative  Sedimentation Rate  Result Value Ref Range   Sed Rate 13 0 - 40 mm/hr  C-reactive protein  Result Value Ref Range   CRP 1 0 - 10 mg/L  CBC with Differential  Result Value Ref Range   WBC 8.4 3.4 - 10.8 x10E3/uL   RBC 4.50 3.77 - 5.28 x10E6/uL   Hemoglobin 13.4 11.1 - 15.9 g/dL   Hematocrit 84.6 96.2 - 46.6 %   MCV 88 79 - 97 fL   MCH 29.8 26.6 - 33.0 pg   MCHC 33.9 31.5 - 35.7 g/dL   RDW 95.2 84.1 - 32.4 %   Platelets 318 150 - 450 x10E3/uL   Neutrophils 52 Not Estab. %   Lymphs 37 Not Estab. %   Monocytes 8 Not Estab. %   Eos 2 Not Estab. %   Basos 1 Not Estab. %   Neutrophils Absolute 4.4 1.4 - 7.0 x10E3/uL   Lymphocytes Absolute 3.2 (H) 0.7 - 3.1 x10E3/uL   Monocytes Absolute 0.7 0.1 - 0.9 x10E3/uL   EOS (ABSOLUTE) 0.2 0.0 - 0.4 x10E3/uL   Basophils Absolute 0.1 0.0 - 0.2 x10E3/uL   Immature Granulocytes 0 Not Estab. %   Immature Grans (Abs) 0.0 0.0 - 0.1 x10E3/uL  T4, free  Result Value Ref Range   Free T4 1.04 0.82 - 1.77 ng/dL  TSH  Result Value Ref Range   TSH 3.710 0.450 - 4.500 uIU/mL  CYCLIC CITRUL PEPTIDE ANTIBODY, IGG/IGA  Result Value Ref Range   Cyclic Citrullin Peptide Ab 29 (H) 0 - 19 units  Patient describes what appears to be more so lateral epicondylitis worse on the right than the left she does not have true hand pain or stiffness does not have joint swelling or redness  Patient does have binge  eating disorder She states recently she has been doing better with her diet and not binging The medicine helps her We will send in refills   Review of Systems     Objective:   Physical Exam  General-in no acute distress Eyes-no discharge Lungs-respiratory rate normal, CTA CV-no murmurs,RRR Extremities skin warm dry no edema Neuro grossly normal Behavior normal, alert  Physical exam does not show any redness or swelling in the joints she has full range of motion of her hands in addition to this also has tenderness on the right side more so than the left side consistent with lateral epicondylitis     Assessment & Plan:  1. Binge eating disorder Continue Vyvanse that is helping her 3 prescription sent in PDMP was checked 2. Arthralgia of multiple sites, bilateral She does not really fit the picture of rheumatoid arthritis her pain in her joints is intermittent no redness or swelling One of her antibodies is positive I do not feel that this puts her in a category of rheumatologic disease but we will reach out to specialist regarding this 3.  Lateral epicondylitis of both elbows On physical exam she does have lateral epicondylitis worse on the right than the left stretching exercises shown may do ice compresses as well if not improved over the next few weeks we can help set her up with orthopedics

## 2023-04-16 ENCOUNTER — Ambulatory Visit: Payer: BC Managed Care – PPO | Admitting: Family Medicine

## 2023-04-25 ENCOUNTER — Other Ambulatory Visit: Payer: Self-pay | Admitting: Family Medicine

## 2023-05-01 NOTE — Telephone Encounter (Signed)
Please clarify with patient whether or not she is requesting this medicine or not could be inappropriate if an appropriate notify pharmacy to take it off her med list

## 2023-05-08 ENCOUNTER — Other Ambulatory Visit: Payer: Self-pay | Admitting: Nurse Practitioner

## 2023-05-22 ENCOUNTER — Ambulatory Visit: Payer: BC Managed Care – PPO | Admitting: Nurse Practitioner

## 2023-05-22 VITALS — BP 126/82 | HR 88 | Temp 97.3°F | Ht 65.0 in | Wt 212.0 lb

## 2023-05-22 DIAGNOSIS — E88819 Insulin resistance, unspecified: Secondary | ICD-10-CM

## 2023-05-22 DIAGNOSIS — E669 Obesity, unspecified: Secondary | ICD-10-CM

## 2023-05-22 DIAGNOSIS — F4310 Post-traumatic stress disorder, unspecified: Secondary | ICD-10-CM

## 2023-05-22 DIAGNOSIS — M791 Myalgia, unspecified site: Secondary | ICD-10-CM

## 2023-05-22 DIAGNOSIS — R5382 Chronic fatigue, unspecified: Secondary | ICD-10-CM

## 2023-05-22 DIAGNOSIS — M2550 Pain in unspecified joint: Secondary | ICD-10-CM

## 2023-05-22 DIAGNOSIS — R7301 Impaired fasting glucose: Secondary | ICD-10-CM

## 2023-05-22 DIAGNOSIS — H04123 Dry eye syndrome of bilateral lacrimal glands: Secondary | ICD-10-CM

## 2023-05-22 MED ORDER — ESCITALOPRAM OXALATE 5 MG PO TABS: 10.0000 mg | ORAL_TABLET | Freq: Every day | ORAL | 1 refills | Status: DC

## 2023-05-22 MED ORDER — ALBUTEROL SULFATE HFA 108 (90 BASE) MCG/ACT IN AERS: 2.0000 | INHALATION_SPRAY | RESPIRATORY_TRACT | 0 refills | Status: DC | PRN

## 2023-05-22 NOTE — Progress Notes (Unsigned)
Subjective:    Patient ID: Sara Powers, female    DOB: [birth year], 57 y.o.   MRN: [id number]  HPI Discuss medications lexapro - curr on half a tab Discus generic for vyvanse   Review of Systems     Objective:   Physical Exam        Assessment & Plan:

## 2023-05-22 NOTE — Patient Instructions (Signed)
Zaditor or Patanol eye drops for allergies

## 2023-05-24 ENCOUNTER — Encounter: Payer: Self-pay | Admitting: Nurse Practitioner

## 2023-08-05 ENCOUNTER — Encounter: Payer: BC Managed Care – PPO | Attending: Family Medicine | Admitting: Nutrition

## 2023-08-05 DIAGNOSIS — E669 Obesity, unspecified: Secondary | ICD-10-CM | POA: Diagnosis not present

## 2023-08-05 DIAGNOSIS — Z6835 Body mass index (BMI) 35.0-35.9, adult: Secondary | ICD-10-CM | POA: Diagnosis not present

## 2023-08-05 DIAGNOSIS — E88819 Insulin resistance, unspecified: Secondary | ICD-10-CM | POA: Insufficient documentation

## 2023-08-05 DIAGNOSIS — Z713 Dietary counseling and surveillance: Secondary | ICD-10-CM | POA: Insufficient documentation

## 2023-08-05 DIAGNOSIS — R7303 Prediabetes: Secondary | ICD-10-CM

## 2023-08-05 NOTE — Progress Notes (Signed)
Medical Nutrition Therapy  Appointment Start time:  1200  Appointment End time:  1300  Primary concerns today: Obesity, Pre Dm Referral diagnosis: r73.03, E66.9 Preferred learning style: No Prerence  Learning readiness: Contemplating. Not ready to give up sodas 100%  NUTRITION ASSESSMENT  57 yr old female referred for  Pre Dm and Obesity. PMH Hyperlipidemia. A1C 5.8%. Drinks Pepsi's, drinks some water. Doesn't cook a lot. Grown kids live in her basement.  She is willing to work with focusing on a whole plant predominant diet  and lifestyle medicine to reverse her pre DM and lose weight and improve overall health. She is not sure she is ready to give up her pepsi's yet though. Clinical Medical Hx:  Past Medical History:  Diagnosis Date   Asthma    Insulin resistance    Migraine    Panic attacks 02/09/2019    Medications: Scheduled  Current Outpatient Medications on File Prior to Visit  Medication Sig Dispense Refill   Multiple Vitamin (MULTIVITAMIN WITH MINERALS) TABS tablet Take 1 tablet by mouth daily.     albuterol (VENTOLIN HFA) 108 (90 Base) MCG/ACT inhaler Inhale 2 puffs into the lungs every 4 (four) hours as needed for wheezing or shortness of breath. 18 g 0   budesonide-formoterol (SYMBICORT) 80-4.5 MCG/ACT inhaler Inhale 2 puffs into the lungs 2 (two) times daily. To prevent wheezing 10.2 g 5   escitalopram (LEXAPRO) 5 MG tablet Take 2 tablets (10 mg total) by mouth daily. 90 tablet 1   No current facility-administered medications on file prior to visit.     Labs:  Lab Results  Component Value Date   HGBA1C 5.8 (H) 09/11/2022      Latest Ref Rng & Units 09/11/2022    3:49 PM 09/27/2021    9:20 AM 11/02/2020    7:41 AM  CMP  Glucose 70 - 99 mg/dL 97  469  629   BUN 6 - 24 mg/dL 10  15    Creatinine 5.28 - 1.00 mg/dL 4.13  2.44    Sodium 010 - 144 mmol/L 139  141    Potassium 3.5 - 5.2 mmol/L 4.2  4.4    Chloride 96 - 106 mmol/L 102  100    CO2 20 - 29 mmol/L 25   24    Calcium 8.7 - 10.2 mg/dL 9.2  9.6     Lipid Panel     Component Value Date/Time   CHOL 214 (H) 09/11/2022 1549   TRIG 237 (H) 09/11/2022 1549   HDL 45 09/11/2022 1549   CHOLHDL 4.8 (H) 09/11/2022 1549   CHOLHDL 3.9 06/06/2014 0717   VLDL 26 06/06/2014 0717   LDLCALC 127 (H) 09/11/2022 1549   LABVLDL 42 (H) 09/11/2022 1549    Notable Signs/Symptoms: None  Lifestyle & Dietary Hx LIves by herself and her grown kids in her basement.   Estimated daily fluid intake: 60 oz Supplements: MVI  Sleep: 6 Stress / self-care:  working with therapist Current average weekly physical activity: Walks   24-Hr Dietary Recall First Meal: Bacon and tomato sourdough bread, Snack:  Second Meal: Lunch and dinner eaten  Snack:  Third Meal: CHicken burger with bun, gravy, slice tomato, Pepsi Snack:  Beverages: Pepsi, water,  Estimated Energy Needs Calories: 1200 Carbohydrate: 135g Protein: 90g Fat: 33g   NUTRITION DIAGNOSIS  NB-1.1 Food and nutrition-related knowledge deficit As related to HIgh calorie high carb diet.  As evidenced by A1C 5.8% and BMI > 30.   NUTRITION INTERVENTION  Nutrition education (E-1) on the following topics:  Nutrition and  Pre Diabetes education provided on My Plate, CHO counting, meal planning, portion sizes, timing of meals, avoiding snacks between meals unless having a low blood sugar, target ranges for A1C and blood sugars, signs/symptoms and treatment of hyper/hypoglycemia, monitoring blood sugars, taking medications as prescribed, benefits of exercising 30 minutes per day and prevention of complications of DM. Lifestyle Medicine  - Whole Food, Plant Predominant Nutrition is highly recommended: Eat Plenty of vegetables, Mushrooms, fruits, Legumes, Whole Grains, Nuts, seeds in lieu of processed meats, processed snacks/pastries red meat, poultry, eggs.    -It is better to avoid simple carbohydrates including: Cakes, Sweet Desserts, Ice Cream, Soda (diet  and regular), Sweet Tea, Candies, Chips, Cookies, Store Bought Juices, Alcohol in Excess of  1-2 drinks a day, Lemonade,  Artificial Sweeteners, Doughnuts, Coffee Creamers, "Sugar-free" Products, etc, etc.  This is not a complete list.....  Exercise: If you are able: 30 -60 minutes a day ,4 days a week, or 150 minutes a week.  The longer the better.  Combine stretch, strength, and aerobic activities.  If you were told in the past that you have high risk for cardiovascular diseases, you may seek evaluation by your heart doctor prior to initiating moderate to intense exercise programs.   Handouts Provided Include  Lifestyle Medicine handouts   Learning Style & Readiness for Change Teaching method utilized: Visual & Auditory  Demonstrated degree of understanding via: Teach Back  Barriers to learning/adherence to lifestyle change: none  Goals Established by Pt Goals  Reduce sodas to 1 a day for 2 weeks and then reduce to 1 EOD until off of them Drink 80 of water per day Cut off electronics 1 hour for bed;  Reduce screen time to  1 hour after work and find something else to do. Exercise 30 minutes a day    MONITORING & EVALUATION Dietary intake, weekly physical activity, and weight  in 1-2 months.  Next Steps  Patient is to work on meal planning and meal prepping. Marland Kitchen

## 2023-08-05 NOTE — Patient Instructions (Signed)
Goals  Reduce sodas to 1 a day for 2 weeks and then reduce to 1 EOD until off of them Drink 80 of water per day Cut off electronics 1 hour for bed;  Reduce screen time to  1 hour after work and find something else to do. Exercise 30 minutes a day

## 2023-08-10 ENCOUNTER — Encounter: Payer: Self-pay | Admitting: Nutrition

## 2023-09-11 ENCOUNTER — Ambulatory Visit: Payer: 59 | Admitting: Nurse Practitioner

## 2023-09-11 ENCOUNTER — Other Ambulatory Visit: Payer: Self-pay | Admitting: Family Medicine

## 2023-09-11 VITALS — BP 121/81 | HR 73 | Temp 97.3°F | Ht 65.0 in | Wt 221.0 lb

## 2023-09-11 DIAGNOSIS — Z1231 Encounter for screening mammogram for malignant neoplasm of breast: Secondary | ICD-10-CM

## 2023-09-11 DIAGNOSIS — F4310 Post-traumatic stress disorder, unspecified: Secondary | ICD-10-CM | POA: Diagnosis not present

## 2023-09-11 DIAGNOSIS — F419 Anxiety disorder, unspecified: Secondary | ICD-10-CM | POA: Diagnosis not present

## 2023-09-11 DIAGNOSIS — R7301 Impaired fasting glucose: Secondary | ICD-10-CM | POA: Diagnosis not present

## 2023-09-11 DIAGNOSIS — E88819 Insulin resistance, unspecified: Secondary | ICD-10-CM

## 2023-09-11 DIAGNOSIS — R5382 Chronic fatigue, unspecified: Secondary | ICD-10-CM

## 2023-09-11 MED ORDER — BUPROPION HCL ER (XL) 150 MG PO TB24: 150.0000 mg | ORAL_TABLET | Freq: Every day | ORAL | 0 refills | Status: DC

## 2023-09-11 MED ORDER — METFORMIN HCL 500 MG PO TABS: 500.0000 mg | ORAL_TABLET | Freq: Two times a day (BID) | ORAL | 0 refills | Status: DC

## 2023-09-11 NOTE — Progress Notes (Addendum)
 Subjective:    Patient ID: Sara Powers, female    DOB: 1965/11/14, 58 y.o.   MRN: [id number]  HPI Presents to discuss medication regimen.  Is requesting a recheck of her hemoglobin A1c, note that she is due for her yearly labs.  Continues to struggle with weight loss.  Has been going to a therapist who has been addressing her overeating from a mental health perspective.  Has also met with our local dietitian.  States she did well on metformin  combined with bupropion .  Doing well on Lexapro  5 mg daily.  Feels this dose is just enough to control her symptoms.  Also has retired from her full-time job and working part-time which has helped.  Has taken Vyvanse  30 mg daily in the past for binge eating but prefers bupropion  due to side effects.    09/11/2023    2:13 PM  Depression screen PHQ 2/9  Decreased Interest 1  Down, Depressed, Hopeless 0  PHQ - 2 Score 1  Altered sleeping 1  Tired, decreased energy 1  Change in appetite 3  Feeling bad or failure about yourself  0  Trouble concentrating 0  Moving slowly or fidgety/restless 0  Suicidal thoughts 0  PHQ-9 Score 6  Difficult doing work/chores Not difficult at all      09/11/2023    2:14 PM 05/22/2023    3:02 PM 01/14/2023    1:09 PM 01/02/2023    4:03 PM  GAD 7 : Generalized Anxiety Score  Nervous, Anxious, on Edge 0 3 0 1  Control/stop worrying 0 0 0 0  Worry too much - different things 0 0 0 0  Trouble relaxing 0 3 0 1  Restless 0 1 0 0  Easily annoyed or irritable 1 2 1 2   Afraid - awful might happen 0 0 1 0  Total GAD 7 Score 1 9 2 4   Anxiety Difficulty Not difficult at all Somewhat difficult Somewhat difficult Not difficult at all      Review of Systems  Respiratory:  Negative for cough, chest tightness, shortness of breath and wheezing.   Cardiovascular:  Negative for chest pain.   Social History   Tobacco Use   Smoking status: Never   Smokeless tobacco: Never  Substance Use Topics   Alcohol use: Yes    Comment:  occ   Drug use: Never        Objective:   Physical Exam NAD.  Alert, oriented.  Calm cheerful affect.  Making good eye contact.  Speech clear.  Thoughts logical coherent and relevant.  Dressed appropriately for the weather.  Lungs clear.  Heart regular rate rhythm. Today's Vitals   09/11/23 1107  BP: 121/81  Pulse: 73  Temp: (!) 97.3 F (36.3 C)  SpO2: 96%  Weight: 221 lb (100.2 kg)  Height: 5' 5 (1.651 m)   Body mass index is 36.78 kg/m.        Assessment & Plan:   Problem List Items Addressed This Visit       Endocrine   Impaired fasting glucose - Primary   Relevant Orders   Comprehensive metabolic panel   Hemoglobin A1c   Lipid panel   Insulin resistance   Relevant Orders   Comprehensive metabolic panel   Hemoglobin A1c   Lipid panel     Other   Anxiety   Relevant Medications   buPROPion  (WELLBUTRIN  XL) 150 MG 24 hr tablet   PTSD (post-traumatic stress disorder)   Relevant Medications  buPROPion  (WELLBUTRIN  XL) 150 MG 24 hr tablet   Other Visit Diagnoses       Chronic fatigue       Relevant Orders   CBC with Differential/Platelet   TSH      Meds ordered this encounter  Medications   metFORMIN  (GLUCOPHAGE ) 500 MG tablet    Sig: Take 1 tablet (500 mg total) by mouth 2 (two) times daily with a meal.    Dispense:  180 tablet    Refill:  0    Supervising Provider:   ALPHONSA HAMILTON A [[id number]]   buPROPion  (WELLBUTRIN  XL) 150 MG 24 hr tablet    Sig: Take 1 tablet (150 mg total) by mouth daily.    Dispense:  90 tablet    Refill:  0    Supervising Provider:   ALPHONSA HAMILTON A [[id number]]   Restart metformin  500 mg twice daily with meals.  Start with once daily dosing for the first week or 2 and if tolerated increase to twice daily. Restart bupropion  XL 150 mg daily.  Continue Lexapro  5 mg daily.  Discontinue medication and contact office if any problems. Continue follow-up with her therapist.  Also encouraged regular exercise. Lab work pending. Return in  about 3 months (around 12/10/2023).

## 2023-09-14 ENCOUNTER — Encounter: Payer: Self-pay | Admitting: Nurse Practitioner

## 2023-09-14 ENCOUNTER — Ambulatory Visit (INDEPENDENT_AMBULATORY_CARE_PROVIDER_SITE_OTHER): Payer: 59 | Admitting: Nurse Practitioner

## 2023-09-14 VITALS — BP 135/71 | HR 69 | Temp 98.1°F | Ht 65.0 in | Wt 222.0 lb

## 2023-09-14 DIAGNOSIS — S161XXA Strain of muscle, fascia and tendon at neck level, initial encounter: Secondary | ICD-10-CM

## 2023-09-14 DIAGNOSIS — J0100 Acute maxillary sinusitis, unspecified: Secondary | ICD-10-CM | POA: Diagnosis not present

## 2023-09-14 MED ORDER — PREDNISONE 20 MG PO TABS: ORAL_TABLET | ORAL | 0 refills | Status: DC

## 2023-09-14 MED ORDER — DOXYCYCLINE HYCLATE 100 MG PO CAPS: 100.0000 mg | ORAL_CAPSULE | Freq: Two times a day (BID) | ORAL | 0 refills | Status: DC

## 2023-09-15 ENCOUNTER — Encounter: Payer: Self-pay | Admitting: Nurse Practitioner

## 2023-09-15 NOTE — Progress Notes (Signed)
   Subjective:    Patient ID: Sara Powers, female    DOB: [birth year], 58 y.o.   MRN: [id number]  HPI Presents for complaints of persistent head congestion that began after a head cold on 12/20.  Maxillary area sinus headache left side.  No sore throat.  Some left ear pain at times.  No fevers.  Now producing green mucus.  Minimal cough.  No chest pain shortness of breath or wheezing.  Slight ringing in the ears at times.  Also complaints of localized left neck pain for the past several days.  No specific history of injury.  Worse with certain movements of the head also worse with cough or sneeze.  No numbness or weakness of the arms or legs.        Objective:   Physical Exam NAD.  Alert, oriented.  TMs clear effusion, no erythema.  Pharynx mildly injected with PND noted.  Neck supple with mild soft anterior cervical adenopathy.  No posterior cervical adenopathy noted.  Lungs clear.  Heart regular rate rhythm.  Localized area of tenderness to palpation just beneath the left occipital area/lateral neck.  No other areas of tenderness noted.  Tenderness with range of motion of the neck. Today's Vitals   09/14/23 1509  BP: 135/71  Pulse: 69  Temp: 98.1 F (36.7 C)  SpO2: 97%  Weight: 222 lb (100.7 kg)  Height: 5' 5 (1.651 m)   Body mass index is 36.94 kg/m.        Assessment & Plan:  Acute non-recurrent maxillary sinusitis  Acute strain of neck muscle, initial encounter Meds ordered this encounter  Medications   predniSONE  (DELTASONE ) 20 MG tablet    Sig: Take 2 tabs (40 mg) po once daily x 5 days    Dispense:  10 tablet    Refill:  0    Supervising Provider:   ALPHONSA HAMILTON A [[id number]]   doxycycline  (VIBRAMYCIN ) 100 MG capsule    Sig: Take 1 capsule (100 mg total) by mouth 2 (two) times daily.    Dispense:  14 capsule    Refill:  0    Supervising Provider:   ALPHONSA HAMILTON A [[id number]]   Start prednisone  and doxycycline  as directed. Ice/heat applications to neck pain.   Recommend gentle exercises.  OTC topicals as directed.  Gentle massage to the area.  Warning signs reviewed.  Call back if any symptoms worsen or persist.

## 2023-09-21 ENCOUNTER — Ambulatory Visit
Admission: RE | Admit: 2023-09-21 | Discharge: 2023-09-21 | Disposition: A | Discharge status: Home / Self Care | Payer: 59 | Source: Ambulatory Visit | Attending: Family Medicine | Admitting: Family Medicine

## 2023-09-21 DIAGNOSIS — Z1231 Encounter for screening mammogram for malignant neoplasm of breast: Secondary | ICD-10-CM

## 2023-10-14 ENCOUNTER — Encounter: Payer: Self-pay | Admitting: Nurse Practitioner

## 2023-10-14 LAB — COMPREHENSIVE METABOLIC PANEL
ALT: 25 [IU]/L (ref 0–32)
AST: 20 [IU]/L (ref 0–40)
Albumin: 4.6 g/dL (ref 3.8–4.9)
Alkaline Phosphatase: 70 [IU]/L (ref 44–121)
BUN/Creatinine Ratio: 12 (ref 9–23)
BUN: 11 mg/dL (ref 6–24)
Bilirubin Total: 0.6 mg/dL (ref 0.0–1.2)
CO2: 23 mmol/L (ref 20–29)
Calcium: 9.7 mg/dL (ref 8.7–10.2)
Chloride: 104 mmol/L (ref 96–106)
Creatinine, Ser: 0.94 mg/dL (ref 0.57–1.00)
Globulin, Total: 2.8 g/dL (ref 1.5–4.5)
Glucose: 103 mg/dL — ABNORMAL HIGH (ref 70–99)
Potassium: 4.3 mmol/L (ref 3.5–5.2)
Sodium: 140 mmol/L (ref 134–144)
Total Protein: 7.4 g/dL (ref 6.0–8.5)
eGFR: 72 mL/min/{1.73_m2} (ref >59)

## 2023-10-14 LAB — CBC WITH DIFFERENTIAL/PLATELET
Basophils Absolute: 0.1 10*3/uL (ref 0.0–0.2)
Basos: 1 %
EOS (ABSOLUTE): 0.2 10*3/uL (ref 0.0–0.4)
Eos: 3 %
Hematocrit: 43.7 % (ref 34.0–46.6)
Hemoglobin: 14.2 g/dL (ref 11.1–15.9)
Immature Grans (Abs): 0 10*3/uL (ref 0.0–0.1)
Immature Granulocytes: 0 %
Lymphocytes Absolute: 2.4 10*3/uL (ref 0.7–3.1)
Lymphs: 39 %
MCH: 29.6 pg (ref 26.6–33.0)
MCHC: 32.5 g/dL (ref 31.5–35.7)
MCV: 91 fL (ref 79–97)
Monocytes Absolute: 0.5 10*3/uL (ref 0.1–0.9)
Monocytes: 8 %
Neutrophils Absolute: 2.9 10*3/uL (ref 1.4–7.0)
Neutrophils: 49 %
Platelets: 316 10*3/uL (ref 150–450)
RBC: 4.8 x10E6/uL (ref 3.77–5.28)
RDW: 12.7 % (ref 11.7–15.4)
WBC: 6 10*3/uL (ref 3.4–10.8)

## 2023-10-14 LAB — LIPID PANEL
Chol/HDL Ratio: 4.6 {ratio} — ABNORMAL HIGH (ref 0.0–4.4)
Cholesterol, Total: 207 mg/dL — ABNORMAL HIGH (ref 100–199)
HDL: 45 mg/dL (ref >39)
LDL Chol Calc (NIH): 127 mg/dL — ABNORMAL HIGH (ref 0–99)
Triglycerides: 196 mg/dL — ABNORMAL HIGH (ref 0–149)
VLDL Cholesterol Cal: 35 mg/dL (ref 5–40)

## 2023-10-14 LAB — HEMOGLOBIN A1C
Est. average glucose Bld gHb Est-mCnc: 126 mg/dL
Hgb A1c MFr Bld: 6 % — ABNORMAL HIGH (ref 4.8–5.6)

## 2023-10-14 LAB — TSH: TSH: 3.26 u[IU]/mL (ref 0.450–4.500)

## 2023-10-17 ENCOUNTER — Other Ambulatory Visit: Payer: Self-pay | Admitting: Nurse Practitioner

## 2023-10-26 ENCOUNTER — Encounter: Payer: 59 | Attending: Family Medicine | Admitting: Nutrition

## 2023-10-26 DIAGNOSIS — Z713 Dietary counseling and surveillance: Secondary | ICD-10-CM | POA: Diagnosis not present

## 2023-10-26 DIAGNOSIS — Z6835 Body mass index (BMI) 35.0-35.9, adult: Secondary | ICD-10-CM | POA: Insufficient documentation

## 2023-10-26 DIAGNOSIS — E785 Hyperlipidemia, unspecified: Secondary | ICD-10-CM | POA: Diagnosis not present

## 2023-10-26 DIAGNOSIS — E669 Obesity, unspecified: Secondary | ICD-10-CM | POA: Insufficient documentation

## 2023-10-26 DIAGNOSIS — E88819 Insulin resistance, unspecified: Secondary | ICD-10-CM | POA: Diagnosis not present

## 2023-10-26 DIAGNOSIS — R7301 Impaired fasting glucose: Secondary | ICD-10-CM | POA: Diagnosis present

## 2023-10-26 NOTE — Patient Instructions (Addendum)
 Goals  Exercise 4 times per week Set alarm on phone to help remind of exercise time. Lose 2 lbs per month Take Metformin 500 mg twice  a day.  Get A1C 5.7% or below

## 2023-10-26 NOTE — Progress Notes (Unsigned)
 Medical Nutrition Therapy  Appointment Start time:  1200  Appointment End time:  1300  Primary concerns today: Obesity, Pre Dm Referral diagnosis: r73.03, E66.9 Preferred learning style: No Prerence  Learning readiness: Contemplating. Not ready to give up sodas 100%  NUTRITION ASSESSMENT  58 yr old female referred for  Pre Dm and Obesity. PMH Hyperlipidemia.   Currently on  500 mg BID of Metformin. A1C 6%. Using sectional plates now.       A1C 5.8%. Drinks Pepsi's, drinks some water. Doesn't cook a lot. Grown kids live in her basement.  She is willing to work with focusing on a whole plant predominant diet  and lifestyle medicine to reverse her pre DM and lose weight and improve overall health. She is not sure she is ready to give up her pepsi's yet though. Clinical Medical Hx:  Past Medical History:  Diagnosis Date   Asthma    Insulin resistance    Migraine    Panic attacks 02/09/2019    Medications: Scheduled  Current Outpatient Medications on File Prior to Visit  Medication Sig Dispense Refill   albuterol (VENTOLIN HFA) 108 (90 Base) MCG/ACT inhaler Inhale 2 puffs into the lungs every 4 (four) hours as needed for wheezing or shortness of breath. 18 g 0   budesonide-formoterol (SYMBICORT) 80-4.5 MCG/ACT inhaler Inhale 2 puffs into the lungs 2 (two) times daily. To prevent wheezing 10.2 g 5   buPROPion (WELLBUTRIN XL) 150 MG 24 hr tablet Take 1 tablet (150 mg total) by mouth daily. 90 tablet 0   doxycycline (VIBRAMYCIN) 100 MG capsule Take 1 capsule (100 mg total) by mouth 2 (two) times daily. 14 capsule 0   escitalopram (LEXAPRO) 5 MG tablet Take 2 tablets (10 mg total) by mouth daily. 90 tablet 1   metFORMIN (GLUCOPHAGE) 500 MG tablet Take 1 tablet (500 mg total) by mouth 2 (two) times daily with a meal. 180 tablet 0   Multiple Vitamin (MULTIVITAMIN WITH MINERALS) TABS tablet Take 1 tablet by mouth daily.     predniSONE (DELTASONE) 20 MG tablet Take 2 tabs (40 mg) po once  daily x 5 days 10 tablet 0   No current facility-administered medications on file prior to visit.     Labs:  Lab Results  Component Value Date   HGBA1C 6.0 (H) 10/13/2023      Latest Ref Rng & Units 10/13/2023    8:13 AM 09/11/2022    3:49 PM 09/27/2021    9:20 AM  CMP  Glucose 70 - 99 mg/dL 784  97  696   BUN 6 - 24 mg/dL 11  10  15    Creatinine 0.57 - 1.00 mg/dL 2.95  2.84  1.32   Sodium 134 - 144 mmol/L 140  139  141   Potassium 3.5 - 5.2 mmol/L 4.3  4.2  4.4   Chloride 96 - 106 mmol/L 104  102  100   CO2 20 - 29 mmol/L 23  25  24    Calcium 8.7 - 10.2 mg/dL 9.7  9.2  9.6   Total Protein 6.0 - 8.5 g/dL 7.4     Total Bilirubin 0.0 - 1.2 mg/dL 0.6     Alkaline Phos 44 - 121 IU/L 70     AST 0 - 40 IU/L 20     ALT 0 - 32 IU/L 25      Lipid Panel     Component Value Date/Time   CHOL 207 (H) 10/13/2023 0813   TRIG 196 (  H) 10/13/2023 0813   HDL 45 10/13/2023 0813   CHOLHDL 4.6 (H) 10/13/2023 0813   CHOLHDL 3.9 06/06/2014 0717   VLDL 26 06/06/2014 0717   LDLCALC 127 (H) 10/13/2023 0813   LABVLDL 35 10/13/2023 0813    Notable Signs/Symptoms: None  Lifestyle & Dietary Hx LIves by herself and her grown kids in her basement.   Estimated daily fluid intake: 60 oz Supplements: MVI  Sleep: 6 Stress / self-care:  working with therapist Current average weekly physical activity: Walks   24-Hr Dietary Recall First Meal: Bacon and tomato sourdough bread, Snack:  Second Meal: Lunch and dinner eaten  Snack:  Third Meal: CHicken burger with bun, gravy, slice tomato, Pepsi Snack:  Beverages: Pepsi, water,  Estimated Energy Needs Calories: 1200 Carbohydrate: 135g Protein: 90g Fat: 33g   NUTRITION DIAGNOSIS  NB-1.1 Food and nutrition-related knowledge deficit As related to HIgh calorie high carb diet.  As evidenced by A1C 5.8% and BMI > 30.   NUTRITION INTERVENTION  Nutrition education (E-1) on the following topics:  Nutrition and  Pre Diabetes education provided on  My Plate, CHO counting, meal planning, portion sizes, timing of meals, avoiding snacks between meals unless having a low blood sugar, target ranges for A1C and blood sugars, signs/symptoms and treatment of hyper/hypoglycemia, monitoring blood sugars, taking medications as prescribed, benefits of exercising 30 minutes per day and prevention of complications of DM. Lifestyle Medicine  - Whole Food, Plant Predominant Nutrition is highly recommended: Eat Plenty of vegetables, Mushrooms, fruits, Legumes, Whole Grains, Nuts, seeds in lieu of processed meats, processed snacks/pastries red meat, poultry, eggs.    -It is better to avoid simple carbohydrates including: Cakes, Sweet Desserts, Ice Cream, Soda (diet and regular), Sweet Tea, Candies, Chips, Cookies, Store Bought Juices, Alcohol in Excess of  1-2 drinks a day, Lemonade,  Artificial Sweeteners, Doughnuts, Coffee Creamers, "Sugar-free" Products, etc, etc.  This is not a complete list.....  Exercise: If you are able: 30 -60 minutes a day ,4 days a week, or 150 minutes a week.  The longer the better.  Combine stretch, strength, and aerobic activities.  If you were told in the past that you have high risk for cardiovascular diseases, you may seek evaluation by your heart doctor prior to initiating moderate to intense exercise programs.   Handouts Provided Include  Lifestyle Medicine handouts   Learning Style & Readiness for Change Teaching method utilized: Visual & Auditory  Demonstrated degree of understanding via: Teach Back  Barriers to learning/adherence to lifestyle change: none  Goals Established by Pt Goals  Reduce sodas to 1 a day for 2 weeks and then reduce to 1 EOD until off of them. Done. Has done 1 a week now. Drink 80 of water per day- improved Cut off electronics 1 hour for bed; - done Reduce screen time to  1 hour after work and find something else to d-working on it. Exercise 30 minutes a day -Still working   MONITORING &  EVALUATION Dietary intake, weekly physical activity, and weight  in 1-2 months.  Next Steps  Patient is to work on meal planning and meal prepping. Marland Kitchen

## 2023-10-29 ENCOUNTER — Encounter: Payer: Self-pay | Admitting: Nutrition

## 2023-11-19 ENCOUNTER — Ambulatory Visit: Payer: BC Managed Care – PPO | Admitting: Family Medicine

## 2023-12-17 ENCOUNTER — Ambulatory Visit: Payer: 59 | Admitting: Nurse Practitioner

## 2023-12-22 ENCOUNTER — Ambulatory Visit: Admitting: Nurse Practitioner

## 2023-12-22 VITALS — BP 126/79 | HR 70 | Temp 98.7°F | Ht 65.0 in | Wt 205.4 lb

## 2023-12-22 DIAGNOSIS — F419 Anxiety disorder, unspecified: Secondary | ICD-10-CM

## 2023-12-22 DIAGNOSIS — R7301 Impaired fasting glucose: Secondary | ICD-10-CM | POA: Diagnosis not present

## 2023-12-22 MED ORDER — METFORMIN HCL 500 MG PO TABS: 500.0000 mg | ORAL_TABLET | Freq: Two times a day (BID) | ORAL | 1 refills | Status: DC

## 2023-12-22 MED ORDER — BUPROPION HCL ER (XL) 150 MG PO TB24: 150.0000 mg | ORAL_TABLET | Freq: Every day | ORAL | 1 refills | Status: DC

## 2023-12-22 MED ORDER — ESCITALOPRAM OXALATE 5 MG PO TABS: 10.0000 mg | ORAL_TABLET | Freq: Every day | ORAL | 1 refills | Status: DC

## 2023-12-23 ENCOUNTER — Encounter: Payer: Self-pay | Admitting: Nurse Practitioner

## 2023-12-23 NOTE — Progress Notes (Signed)
 Subjective:    Patient ID: Sara Powers, female    DOB: 06/24/1966, 58 y.o.   MRN: [id number]  HPI Presents for routine follow-up for impaired fasting glucose/prediabetes and her anxiety.  Adherent to medication regimen.  Doing well on current dose of escitalopram and bupropion.  Stress level has greatly improved since retirement, working part-time when she chooses.  Also taking metformin twice daily.  In the past she states that bupropion and metformin have worked great to help her lose weight.  Regular activity such as walking.  Has been following Noom to help her with her weight.  Gets regular preventive health physicals and mammograms through gynecology.   Review of Systems  Respiratory:  Negative for cough, chest tightness, shortness of breath and wheezing.        Very rare use of Symbicort or albuterol.  Cardiovascular:  Negative for chest pain.        12/22/2023    2:03 PM  Depression screen PHQ 2/9  Decreased Interest 1  Down, Depressed, Hopeless 0  PHQ - 2 Score 1  Altered sleeping 1  Tired, decreased energy 1  Change in appetite 0  Feeling bad or failure about yourself  0  Trouble concentrating 0  Moving slowly or fidgety/restless 0  Suicidal thoughts 0  PHQ-9 Score 3  Difficult doing work/chores Not difficult at all      12/22/2023    2:04 PM 09/14/2023    3:14 PM 09/11/2023    2:14 PM 05/22/2023    3:02 PM  GAD 7 : Generalized Anxiety Score  Nervous, Anxious, on Edge 0 0 0 3  Control/stop worrying 0 1 0 0  Worry too much - different things 0 0 0 0  Trouble relaxing 0 0 0 3  Restless 0 0 0 1  Easily annoyed or irritable 0 1 1 2   Afraid - awful might happen 0 0 0 0  Total GAD 7 Score 0 2 1 9   Anxiety Difficulty Not difficult at all Somewhat difficult Not difficult at all Somewhat difficult        Objective:   Physical Exam NAD.  Alert, oriented.  Calm cheerful affect.  Making good eye contact.  Speech clear.  Dressed appropriately for the weather.   Thoughts logical coherent and relevant.  Normal judgment.  Lungs clear.  Heart regular rate rhythm. Today's Vitals   12/22/23 1358  BP: 126/79  Pulse: 70  Temp: 98.7 F (37.1 C)  SpO2: 96%  Weight: 205 lb 6.4 oz (93.2 kg)  Height: 5\' 5"  (1.651 m)   Body mass index is 34.18 kg/m. Has lost 10 pounds since 2/17.       Assessment & Plan:   Problem List Items Addressed This Visit       Endocrine   Impaired fasting glucose - Primary     Other   Anxiety   Relevant Medications   buPROPion (WELLBUTRIN XL) 150 MG 24 hr tablet   escitalopram (LEXAPRO) 5 MG tablet   Meds ordered this encounter  Medications   buPROPion (WELLBUTRIN XL) 150 MG 24 hr tablet    Sig: Take 1 tablet (150 mg total) by mouth daily.    Dispense:  90 tablet    Refill:  1    Supervising Provider:   Lilyan Punt A [[id number]]   escitalopram (LEXAPRO) 5 MG tablet    Sig: Take 2 tablets (10 mg total) by mouth daily.    Dispense:  90 tablet  Refill:  1    This prescription was filled on 09/12/2023. Any refills authorized will be placed on file.    Supervising Provider:   Charlotta Cook A [[id number]]   metFORMIN (GLUCOPHAGE) 500 MG tablet    Sig: Take 1 tablet (500 mg total) by mouth 2 (two) times daily with a meal.    Dispense:  180 tablet    Refill:  1    Supervising Provider:   Charlotta Cook A [[id number]]   Continue current medication regimen as directed. Continue regular activity and use of Noom to help with her weight loss. Recommend patient consider pneumonia vaccine and Shingrix. Return in about 6 months (around 06/22/2024).

## 2024-01-20 ENCOUNTER — Ambulatory Visit: Payer: 59 | Admitting: Nutrition

## 2024-02-03 ENCOUNTER — Ambulatory Visit: Admitting: Family Medicine

## 2024-02-03 VITALS — BP 119/76 | HR 70 | Temp 99.0°F | Ht 65.0 in | Wt 205.8 lb

## 2024-02-03 DIAGNOSIS — J0100 Acute maxillary sinusitis, unspecified: Secondary | ICD-10-CM | POA: Diagnosis not present

## 2024-02-03 MED ORDER — DOXYCYCLINE HYCLATE 100 MG PO TABS: 100.0000 mg | ORAL_TABLET | Freq: Two times a day (BID) | ORAL | 0 refills | Status: DC

## 2024-02-03 MED ORDER — ESCITALOPRAM OXALATE 5 MG PO TABS: ORAL_TABLET | ORAL | 1 refills | Status: DC

## 2024-02-03 NOTE — Progress Notes (Signed)
   Subjective:    Patient ID: Sara Powers, female    DOB: 1966-04-10, 58 y.o.   MRN: [id number]  HPI Pt comes in today with complaints of drainage, bad taste in the back of mouth, and sinus pressure for approx 3-4 weeks. Pt denies any fever or body aches. Medications and allergies reviewed Discussed the use of AI scribe software for clinical note transcription with the patient, who gave verbal consent to proceed.  History of Present Illness   Sara Powers is a 58 year old female with alpha-gal syndrome who presents with persistent throat discomfort and sinus pain.  She has been experiencing persistent throat discomfort for the past three to four weeks, described as a sensation of a 'glob' in her throat that she cannot swallow or cough up, along with a 'nasty taste' in the back of her throat. She has been using Claritin at night but sometimes forgets to take it.  She describes ongoing sinus pain, particularly in the maxillary sinuses, which 'hurts all the time.' She notes an irritating post-nasal drip and significant ear drainage. No fever, chills, or wheezing that impairs her breathing.  Her current medications include Claritin, Lexapro  5 mg at night, generic Wellbutrin , and metformin . She reports issues with obtaining her Lexapro  prescription recently.  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 She is planning a trip to [place] with friends.        Review of Systems     Objective:   Physical Exam  General-in no acute distress Eyes-no discharge Lungs-respiratory rate normal, CTA CV-no murmurs,RRR Extremities skin warm dry no edema Neuro grossly normal Behavior normal, alert Eardrums normal throat normal      Assessment & Plan:   Assessment and Plan    Allergic rhinitis with sinusitis Chronic allergic rhinitis with sinusitis exacerbated by seasonal allergies. Symptoms include  post-nasal drip, throat globus sensation, and maxillary sinus pain. Inconsistent Claritin use noted. - Prescribed doxycycline  100 mg twice daily with a snack for sinusitis. - Advised taking doxycycline  with a tall glass of liquid to prevent heartburn. - Warned about increased sunburn risk with doxycycline ; advised avoiding prolonged sun exposure. - Instructed to avoid taking doxycycline  with milk.  Depression and anxiety Ongoing management with Lexapro  and Wellbutrin . Lexapro  5 mg not refilled due to pharmacy error. Reports situational work stress, denies need for additional antidepressants. Willing to continue counseling. - Sent prescription for Lexapro  5 mg once daily at night to [facility]. - Continue Wellbutrin  as prescribed. - Encouraged ongoing counseling for anxiety and depression management.      Go ahead and treat for sinusitis

## 2024-05-24 ENCOUNTER — Ambulatory Visit: Admitting: Nurse Practitioner

## 2024-05-24 VITALS — BP 152/94 | HR 73 | Ht 65.0 in | Wt 222.0 lb

## 2024-05-24 DIAGNOSIS — G4733 Obstructive sleep apnea (adult) (pediatric): Secondary | ICD-10-CM

## 2024-05-24 DIAGNOSIS — R5383 Other fatigue: Secondary | ICD-10-CM

## 2024-05-24 DIAGNOSIS — M79644 Pain in right finger(s): Secondary | ICD-10-CM | POA: Diagnosis not present

## 2024-05-24 DIAGNOSIS — F4323 Adjustment disorder with mixed anxiety and depressed mood: Secondary | ICD-10-CM | POA: Diagnosis not present

## 2024-05-24 MED ORDER — ESCITALOPRAM OXALATE 5 MG PO TABS: ORAL_TABLET | ORAL | 1 refills | Status: DC

## 2024-05-25 ENCOUNTER — Encounter: Payer: Self-pay | Admitting: Nurse Practitioner

## 2024-05-25 LAB — COMPREHENSIVE METABOLIC PANEL WITH GFR
ALT: 30 IU/L (ref 0–32)
AST: 27 IU/L (ref 0–40)
Albumin: 4.5 g/dL (ref 3.8–4.9)
Alkaline Phosphatase: 93 IU/L (ref 49–135)
BUN/Creatinine Ratio: 17 (ref 9–23)
BUN: 14 mg/dL (ref 6–24)
Bilirubin Total: 0.5 mg/dL (ref 0.0–1.2)
CO2: 24 mmol/L (ref 20–29)
Calcium: 9.8 mg/dL (ref 8.7–10.2)
Chloride: 102 mmol/L (ref 96–106)
Creatinine, Ser: 0.84 mg/dL (ref 0.57–1.00)
Globulin, Total: 2.7 g/dL (ref 1.5–4.5)
Glucose: 98 mg/dL (ref 70–99)
Potassium: 4.4 mmol/L (ref 3.5–5.2)
Sodium: 140 mmol/L (ref 134–144)
Total Protein: 7.2 g/dL (ref 6.0–8.5)
eGFR: 82 mL/min/1.73 (ref >59)

## 2024-05-25 LAB — CBC WITH DIFFERENTIAL/PLATELET
Basophils Absolute: 0.1 x10E3/uL (ref 0.0–0.2)
Basos: 1 %
EOS (ABSOLUTE): 0.3 x10E3/uL (ref 0.0–0.4)
Eos: 3 %
Hematocrit: 41.1 % (ref 34.0–46.6)
Hemoglobin: 13.8 g/dL (ref 11.1–15.9)
Immature Grans (Abs): 0 x10E3/uL (ref 0.0–0.1)
Immature Granulocytes: 0 %
Lymphocytes Absolute: 2.8 x10E3/uL (ref 0.7–3.1)
Lymphs: 35 %
MCH: 30.3 pg (ref 26.6–33.0)
MCHC: 33.6 g/dL (ref 31.5–35.7)
MCV: 90 fL (ref 79–97)
Monocytes Absolute: 0.6 x10E3/uL (ref 0.1–0.9)
Monocytes: 8 %
Neutrophils Absolute: 4.2 x10E3/uL (ref 1.4–7.0)
Neutrophils: 53 %
Platelets: 325 x10E3/uL (ref 150–450)
RBC: 4.56 x10E6/uL (ref 3.77–5.28)
RDW: 12.3 % (ref 11.7–15.4)
WBC: 7.9 x10E3/uL (ref 3.4–10.8)

## 2024-05-25 LAB — TSH: TSH: 3.03 u[IU]/mL (ref 0.450–4.500)

## 2024-05-25 NOTE — Progress Notes (Signed)
 Subjective:    Patient ID: Sara Powers, female    DOB: 1965-11-01, 58 y.o.   MRN: [id number]  Hand Pain  Pertinent negatives include no chest pain.   Discussed the use of AI scribe software for clinical note transcription with the patient, who gave verbal consent to proceed.  History of Present Illness Sara Powers is a 58 year old female who presents with joint pain in her hands.  She has been experiencing joint pain in her hands for the past three weeks, which has progressively worsened. The pain is primarily located in the middle and small fingers of her right hand, with a particularly sore spot noted. She describes difficulty bending these fingers and mentions a tingling sensation in the area. Being right-handed, the pain is worse in the morning.  She has a history of alpha-gal syndrome, which typically causes joint pain when she consumes certain foods. However, the current joint pain is different from her usual symptoms and occurs even without dietary triggers. She also reports elevated blood pressure lately and generalized joint pain upon waking, including in her feet.  She mentions a past injury to her pinky finger, which was not broken but may have contributed to her current symptoms. She has not tried any braces yet, as she was unsure which type would be appropriate.  She is currently taking Wellbutrin  and has previously been on Lexapro , which she stopped about a month ago. She reports significant fatigue, weight gain, and high blood pressure since stopping Lexapro  and returning to work. States work is a major source of her stress and anxiety. Also, her ex husband has moved back locally which has caused some emotional distress. She also experiences dry eyes and has a family history of autoimmune disease, raising concerns about possible Sjogren's syndrome.  She has been diagnosed with sleep apnea but does not have a CPAP machine due to insurance issues. She experiences extreme  fatigue, often falling asleep unintentionally, and has difficulty maintaining energy levels throughout the day.   Review of Systems  Constitutional:  Positive for fatigue.  HENT:  Negative for sore throat and trouble swallowing.   Eyes:        Dry eyes   Respiratory:  Negative for cough, chest tightness and shortness of breath.   Cardiovascular:  Negative for chest pain.  Musculoskeletal:        Right hand pain specifically ring and small fingers. Pain with flexion.       05/24/2024    3:48 PM  Depression screen PHQ 2/9  Decreased Interest 2  Down, Depressed, Hopeless 0  PHQ - 2 Score 2  Altered sleeping 2  Tired, decreased energy 3  Change in appetite 3  Feeling bad or failure about yourself  1  Trouble concentrating 1  Moving slowly or fidgety/restless 1  Suicidal thoughts 0  PHQ-9 Score 13  Difficult doing work/chores Somewhat difficult      05/24/2024    3:49 PM 02/03/2024    4:20 PM 12/22/2023    2:04 PM 09/14/2023    3:14 PM  GAD 7 : Generalized Anxiety Score  Nervous, Anxious, on Edge 1 0 0 0  Control/stop worrying 0 0 0 1  Worry too much - different things 0 0 0 0  Trouble relaxing 0 0 0 0  Restless 0 0 0 0  Easily annoyed or irritable 0 2 0 1  Afraid - awful might happen 0 0 0 0  Total GAD 7 Score 1  2 0 2  Anxiety Difficulty  Not difficult at all Not difficult at all Somewhat difficult    No suicidal thoughts or ideation. No self harm behaviors.     Objective:   Physical Exam Vitals and nursing note reviewed.  Constitutional:      General: She is not in acute distress.    Comments: Fatigued in appearance.   Neck:     Comments: Thyroid non tender to palpation. No mass or goiter noted.  Cardiovascular:     Rate and Rhythm: Normal rate and regular rhythm.     Heart sounds: Normal heart sounds.  Pulmonary:     Effort: Pulmonary effort is normal.     Breath sounds: Normal breath sounds.  Musculoskeletal:     Cervical back: Neck supple.     Comments: No  tenderness with palpation of the fingers on the right hand. Distinct tenderness noted at the base of the right ring finger palmar aspect. Can perform flexion with tenderness noted. Hand strength normal bilaterally. Sensation grossly intact.   Skin:    General: Skin is warm and dry.  Neurological:     Mental Status: She is alert and oriented to person, place, and time.  Psychiatric:        Mood and Affect: Mood normal.        Behavior: Behavior normal.        Thought Content: Thought content normal.        Judgment: Judgment normal.     Comments: Calm affect. Speech clear. Making good eye contact.     Today's Vitals   05/24/24 1546  BP: (!) 152/94  Pulse: 73  SpO2: 98%  Weight: 222 lb (100.7 kg)  Height: 5' 5 (1.651 m)   Body mass index is 36.94 kg/m.'       Assessment & Plan:  1. Fatigue, unspecified type (Primary) Severe fatigue coinciding with stress, work return, and medication changes. Possible causes include stress, medication side effects, and sleep apnea. - Conduct CBC, metabolic profile, and thyroid function tests. - Address sleep apnea with CPAP therapy referral. - Monitor improvement with Lexapro  and CPAP therapy. - Comprehensive metabolic panel with GFR - CBC with Differential/Platelet - TSH  2. OSA (obstructive sleep apnea) Patient states she has had testing but was unable to get CPAP through insurance. - Ambulatory referral to Neurology for evaluation.  3. Adjustment reaction with anxiety and depression Increased symptoms possibly due to stress, medication changes, and life events. Discontinuation of Lexapro  may have contributed. - Restart Lexapro  at 5 mg daily, increase to 10 mg if no improvement. - Monitor response to Lexapro  and adjust dosage as needed.  - escitalopram  (LEXAPRO ) 5 MG tablet; Take 1 tab po qd  Dispense: 90 tablet; Refill: 1  4. 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 Negative for median nerve involvement. - Use splint for affected fingers, especially at night. - Consider brace for two or three fingers. - Refer to hand specialist if symptoms persist after two weeks. - OTC NSAIDs as directed.   Reviewed labs from 01/02/2023 including CCP, CRP, Sed rate, ANA and RA factor, all of which was normal. Consider repeating labs if indicated.   /Return if symptoms worsen or fail to improve.

## 2024-05-28 ENCOUNTER — Ambulatory Visit: Payer: Self-pay | Admitting: Nurse Practitioner

## 2024-06-09 ENCOUNTER — Encounter: Payer: Self-pay | Admitting: Nurse Practitioner

## 2024-06-22 ENCOUNTER — Ambulatory Visit: Admitting: Family Medicine

## 2024-07-16 ENCOUNTER — Encounter

## 2024-08-12 ENCOUNTER — Ambulatory Visit: Admitting: Obstetrics and Gynecology

## 2024-09-09 ENCOUNTER — Other Ambulatory Visit: Payer: Self-pay | Admitting: Family Medicine

## 2024-09-09 MED ORDER — ALBUTEROL SULFATE HFA 108 (90 BASE) MCG/ACT IN AERS: 2.0000 | INHALATION_SPRAY | RESPIRATORY_TRACT | 0 refills | Status: AC | PRN

## 2024-09-09 NOTE — Telephone Encounter (Signed)
 Copied from CRM [id number]. Topic: Clinical - Medication Refill >> Sep 09, 2024  9:51 AM Victoria B wrote: Medication: albuterol  (VENTOLIN  HFA) 108 (90 Base) MCG/ACT inhaler  Has the patient contacted their pharmacy? no  This is the patient's preferred pharmacy:  [facility] [employer], [place] - [id number] [place] [address] Phone: [phone] Fax: [phone]  Is this the correct pharmacy for this prescription? yes    Has the prescription been filled recently? no  Is the patient out of the medication? yes  Has the patient been seen for an appointment in the last year OR does the patient have an upcoming appointment? yes  Can we respond through MyChart? yes  Agent: Please be advised that Rx refills may take up to 3 business days. We ask that you follow-up with your pharmacy.

## 2024-09-21 ENCOUNTER — Other Ambulatory Visit: Payer: Self-pay | Admitting: Nurse Practitioner

## 2024-10-07 ENCOUNTER — Ambulatory Visit: Payer: Self-pay

## 2024-10-07 ENCOUNTER — Ambulatory Visit: Admitting: Nurse Practitioner

## 2024-10-07 VITALS — BP 146/87 | HR 85 | Temp 97.7°F | Ht 65.0 in | Wt 232.4 lb

## 2024-10-07 DIAGNOSIS — G4733 Obstructive sleep apnea (adult) (pediatric): Secondary | ICD-10-CM | POA: Diagnosis not present

## 2024-10-07 DIAGNOSIS — M5442 Lumbago with sciatica, left side: Secondary | ICD-10-CM

## 2024-10-07 MED ORDER — PREDNISONE 20 MG PO TABS: ORAL_TABLET | ORAL | 0 refills | Status: AC

## 2024-10-07 NOTE — Progress Notes (Unsigned)
" ° °  Subjective:    Patient ID: Sara Powers, female    DOB: 13-Sep-1965, 59 y.o.   MRN: [id number]  HPI Patient is here for experiencing low back pain that runs down to her knee to her feet.  Sciatica pain Makes her catch herself at times when walking   Review of Systems     Objective:   Physical Exam        Assessment & Plan:    "

## 2024-10-07 NOTE — Telephone Encounter (Signed)
" ° °  FYI Only or Action Required?: FYI only for provider: appointment scheduled on 01.30.26.  Patient was last seen in primary care on 05/24/2024 by Mauro Elveria BROCKS, NP.  Called Nurse Triage reporting Hip Pain.  Symptoms began about a month ago.  Interventions attempted: OTC medications: Tylenol .  Symptoms are: gradually worsening.  Triage Disposition: See PCP When Office is Open (Within 3 Days)  Patient/caregiver understands and will follow disposition?: Yes    Message from [facility] sent at 10/07/2024 12:04 PM EST  Reason for Triage: Sciatica pain on left side, lower back and foot all feeling pain when she tries to move   Reason for Disposition  [1] MODERATE pain (e.g., interferes with normal activities, limping) AND [2] present > 3 days  Answer Assessment - Initial Assessment Questions 1. LOCATION and RADIATION: Where is the pain located? Does the pain spread (shoot) anywhere else?     Left hip/back  2. QUALITY: What does the pain feel like?  (e.g., sharp, dull, aching, burning)     Sharp    3. SEVERITY: How bad is the pain? What does it keep you from doing?   (Scale 1-10; or mild, moderate, severe)     Hurts when moving  4. ONSET: When did the pain start? Does it come and go, or is it there all the time?     X 1 mont comes and goes   6. CAUSE: What do you think is causing the hip pain?      Sciatic  7. AGGRAVATING FACTORS: What makes the hip pain worse? (e.g., walking, climbing stairs, running)     Getting out of the car, sitting upright while driving  8. OTHER SYMPTOMS: Do you have any other symptoms? (e.g., back pain, pain shooting down leg,  fever, rash)     Pain radiates down left leg to left foot  Pt reports left hip pain, back pain Pt is taking OTC Tylenol  Pt scheduled for a visit on 01.30.26 for further evaluation. Pt agrees with plan of care, will call back for any worsening symptoms  Protocols used: Hip Pain-A-AH  "

## 2024-10-07 NOTE — Progress Notes (Unsigned)
 "  Subjective:    Patient ID: Sara Powers, female    DOB: 1966/04/18, 59 y.o.   MRN: [id number]  HPI Patient is a 59 year old female with a history of asthma, obstructive sleep apnea, insulin resistance, obesity, anxiety, and depression. She presents with acute back pain radiating to the left hip, buttock, and thigh that started one month ago and has worsened over the past week. Pain increases when sitting and lifting left knee or leg. Patient has treated the pain with tylenol  and heat without relief. Patient denies numbness or tingling in bilateral lower extremities. She denies any changes in bowel or bladder habits, chest pain, or dyspnea.   Review of Systems  Respiratory:  Negative for apnea.   Cardiovascular:  Negative for chest pain.  Musculoskeletal:  Positive for back pain.       Lower back pain radiating to left hip, buttock, and thigh.  Neurological:  Negative for weakness and numbness.      05/24/2024    3:49 PM 02/03/2024    4:20 PM 12/22/2023    2:04 PM 09/14/2023    3:14 PM  GAD 7 : Generalized Anxiety Score  Nervous, Anxious, on Edge 1  0  0  0   Control/stop worrying 0  0  0  1   Worry too much - different things 0  0  0  0   Trouble relaxing 0  0  0  0   Restless 0  0  0  0   Easily annoyed or irritable 0  2  0  1   Afraid - awful might happen 0  0  0  0   Total GAD 7 Score 1 2 0 2  Anxiety Difficulty  Not difficult at all Not difficult at all Somewhat difficult     Data saved with a previous flowsheet row definition      05/24/2024    3:48 PM  Depression screen PHQ 2/9  Decreased Interest 2  Down, Depressed, Hopeless 0  PHQ - 2 Score 2  Altered sleeping 2  Tired, decreased energy 3  Change in appetite 3  Feeling bad or failure about yourself  1  Trouble concentrating 1  Moving slowly or fidgety/restless 1  Suicidal thoughts 0  PHQ-9 Score 13   Difficult doing work/chores Somewhat difficult     Data saved with a previous flowsheet row definition     Social History[1]      Objective:   Physical Exam Constitutional:      General: She is in acute distress.     Appearance: Normal appearance.  Cardiovascular:     Rate and Rhythm: Normal rate and regular rhythm.     Heart sounds: Normal heart sounds.  Pulmonary:     Effort: Pulmonary effort is normal.     Breath sounds: Normal breath sounds.  Musculoskeletal:        General: Tenderness present.     Comments: Pain in left hip, buttock, and thigh. Straight leg test produced sciatica at a 45 degree angle.  Neurological:     Mental Status: She is alert and oriented to person, place, and time.     Cranial Nerves: Cranial nerve deficit present.     Sensory: No sensory deficit.     Motor: No weakness.     Gait: Gait normal.     Deep Tendon Reflexes: Reflexes normal.  Psychiatric:        Mood and Affect: Mood normal.  Behavior: Behavior normal.        Thought Content: Thought content normal.    Vitals:   10/07/24 1410  BP: (!) 146/87  Pulse: 85  Temp: 97.7 F (36.5 C)  Height: 5' 5 (1.651 m)  Weight: 232 lb 6 oz (105.4 kg)  SpO2: 95%  BMI (Calculated): 38.67       Assessment & Plan:  1. Acute left-sided low back pain with left-sided sciatica (Primary) - Discussed the importance of pursuing lifestyle changes such as exercise and weight loss - predniSONE  (DELTASONE ) 20 MG tablet; Take 2 tabs (40 mg) po once a day x 5 days  Dispense: 10 tablet; Refill: 0 - DG Lumbar Spine Complete   2. OSA (Obstructive Sleep Apnea) - Referral to Neurology to evaluate sleep study results   Return if symptoms worsen or fail to improve.      [1]  Social History Tobacco Use   Smoking status: Never   Smokeless tobacco: Never  Substance Use Topics   Alcohol use: Yes    Comment: occ   Drug use: Never   "

## 2024-10-09 ENCOUNTER — Encounter: Payer: Self-pay | Admitting: Nurse Practitioner

## 2024-10-13 ENCOUNTER — Telehealth: Payer: Self-pay

## 2024-10-13 NOTE — Telephone Encounter (Unsigned)
 Copied from CRM [id number]. Topic: Clinical - Medication Question >> Oct 13, 2024  2:44 PM Sara Powers wrote: Reason for CRM: Pt called and reports discussing receiving wegovy in the pill form during her last visit. Please advise   Best contact: [phone]  [facility] [employer], [place] - [address] [place] [address] Phone: [phone] Fax: [phone]

## 2024-10-14 ENCOUNTER — Encounter: Payer: Self-pay | Admitting: Family Medicine

## 2024-10-14 ENCOUNTER — Other Ambulatory Visit: Payer: Self-pay | Admitting: Family Medicine

## 2024-10-14 NOTE — Telephone Encounter (Signed)
 "  Sent patient a MyChart message regarding this.   "

## 2024-10-14 NOTE — Telephone Encounter (Signed)
 Additional information was sent to the patient regarding Wegovy She will need to read over this and respond before initiating any medicines

## 2024-10-14 NOTE — Telephone Encounter (Signed)
 Nurses I reviewed Sara Powers's note-that is who saw her last-I do not see a discussion regarding Wegovy use in the note As for the [facility] tablet cost of that particular medication starts at $149 a month he moves up with up with the higher doses According to our electronic records this medication is not covered by her insurance Please touch base regarding all of this issue with the patient regarding what her thoughts are

## 2024-10-20 ENCOUNTER — Ambulatory Visit: Admitting: Obstetrics and Gynecology
